# Patient Record
Sex: Female | Born: 1965 | Race: Black or African American | Hispanic: No | Marital: Single | Smoking: Former smoker
Health system: Southern US, Community
[De-identification: ages and names within clinical notes are randomized; demographics above are authoritative.]

## PROBLEM LIST (undated history)

## (undated) DIAGNOSIS — R002 Palpitations: Secondary | ICD-10-CM

## (undated) DIAGNOSIS — R202 Paresthesia of skin: Secondary | ICD-10-CM

## (undated) DIAGNOSIS — E669 Obesity, unspecified: Secondary | ICD-10-CM

## (undated) DIAGNOSIS — D219 Benign neoplasm of connective and other soft tissue, unspecified: Secondary | ICD-10-CM

## (undated) DIAGNOSIS — R200 Anesthesia of skin: Secondary | ICD-10-CM

## (undated) DIAGNOSIS — D649 Anemia, unspecified: Secondary | ICD-10-CM

## (undated) HISTORY — DX: Benign neoplasm of connective and other soft tissue, unspecified: D21.9

## (undated) HISTORY — DX: Obesity, unspecified: E66.9

---

## 2003-10-08 ENCOUNTER — Emergency Department (HOSPITAL_COMMUNITY): Admission: EM | Admit: 2003-10-08 | Discharge: 2003-10-08 | Payer: Self-pay | Admitting: Family Medicine

## 2005-06-13 ENCOUNTER — Encounter: Admission: RE | Admit: 2005-06-13 | Discharge: 2005-06-13 | Payer: Self-pay | Admitting: Family Medicine

## 2005-08-03 ENCOUNTER — Other Ambulatory Visit: Admission: RE | Admit: 2005-08-03 | Discharge: 2005-08-03 | Payer: Self-pay | Admitting: Family Medicine

## 2005-08-10 ENCOUNTER — Encounter: Admission: RE | Admit: 2005-08-10 | Discharge: 2005-08-10 | Payer: Self-pay | Admitting: Family Medicine

## 2007-10-11 ENCOUNTER — Emergency Department (HOSPITAL_COMMUNITY): Admission: EM | Admit: 2007-10-11 | Discharge: 2007-10-11 | Payer: Self-pay | Admitting: Family Medicine

## 2010-07-10 ENCOUNTER — Emergency Department (HOSPITAL_COMMUNITY): Admission: EM | Admit: 2010-07-10 | Discharge: 2010-07-10 | Payer: Self-pay | Admitting: Family Medicine

## 2010-07-13 ENCOUNTER — Emergency Department (HOSPITAL_COMMUNITY)
Admission: EM | Admit: 2010-07-13 | Discharge: 2010-07-13 | Payer: Self-pay | Source: Home / Self Care | Admitting: Family Medicine

## 2010-10-25 LAB — CULTURE, ROUTINE-ABSCESS: Culture: NO GROWTH

## 2011-05-05 LAB — INFLUENZA A AND B ANTIGEN (CONVERTED LAB)
Inflenza A Ag: POSITIVE — AB
Influenza B Ag: NEGATIVE

## 2011-08-22 ENCOUNTER — Other Ambulatory Visit: Payer: Self-pay | Admitting: Family Medicine

## 2011-08-22 DIAGNOSIS — Z1231 Encounter for screening mammogram for malignant neoplasm of breast: Secondary | ICD-10-CM

## 2011-09-21 ENCOUNTER — Ambulatory Visit (HOSPITAL_COMMUNITY)
Admission: RE | Admit: 2011-09-21 | Discharge: 2011-09-21 | Disposition: A | Discharge status: Home / Self Care | Payer: Self-pay | Source: Ambulatory Visit | Attending: Family Medicine | Admitting: Family Medicine

## 2011-09-21 DIAGNOSIS — Z1231 Encounter for screening mammogram for malignant neoplasm of breast: Secondary | ICD-10-CM

## 2011-11-23 ENCOUNTER — Emergency Department (HOSPITAL_COMMUNITY)
Admission: EM | Admit: 2011-11-23 | Discharge: 2011-11-23 | Disposition: A | Discharge status: Home / Self Care | Payer: Self-pay | Attending: Emergency Medicine | Admitting: Emergency Medicine

## 2011-11-23 ENCOUNTER — Encounter (HOSPITAL_COMMUNITY): Payer: Self-pay | Admitting: *Deleted

## 2011-11-23 DIAGNOSIS — M545 Low back pain, unspecified: Secondary | ICD-10-CM | POA: Insufficient documentation

## 2011-11-23 DIAGNOSIS — J45909 Unspecified asthma, uncomplicated: Secondary | ICD-10-CM | POA: Insufficient documentation

## 2011-11-23 DIAGNOSIS — M549 Dorsalgia, unspecified: Secondary | ICD-10-CM

## 2011-11-23 MED ORDER — HYDROCODONE-ACETAMINOPHEN 5-325 MG PO TABS: 2.0000 | ORAL_TABLET | ORAL | Status: AC | PRN

## 2011-11-23 MED ORDER — CYCLOBENZAPRINE HCL 10 MG PO TABS: 10.0000 mg | ORAL_TABLET | Freq: Two times a day (BID) | ORAL | Status: AC | PRN

## 2011-11-23 MED ORDER — IBUPROFEN 600 MG PO TABS: 600.0000 mg | ORAL_TABLET | Freq: Four times a day (QID) | ORAL | Status: AC | PRN

## 2011-11-23 NOTE — ED Provider Notes (Signed)
History     CSN: [id number]  Arrival date & time 11/23/11  1556   First MD Initiated Contact with Patient 11/23/11 1707      Chief Complaint  Patient presents with  . Back Pain   HPI Pt has had increase in lower back pain. Pt denies any injury. Pt has increase of pain with movement.          Past Medical History  Diagnosis Date  . Asthma     History reviewed. No pertinent past surgical history.  History reviewed. No pertinent family history.  History  Substance Use Topics  . Smoking status: Never Smoker   . Smokeless tobacco: Not on file  . Alcohol Use: Yes     occasionally    OB History    Grav Para Term Preterm Abortions TAB SAB Ect Mult Living                  Review of Systems  All other systems reviewed and are negative.    Allergies  Review of patient's allergies indicates no known allergies.  Home Medications   Current Outpatient Rx  Name Route Sig Dispense Refill  . ACETAMINOPHEN ER 650 MG PO TBCR Oral Take 1,300 mg by mouth once. For pain    . FLUTICASONE PROPIONATE  HFA 220 MCG/ACT IN AERO Inhalation Inhale 1 puff into the lungs 2 (two) times daily as needed. For wheezing    . CYCLOBENZAPRINE HCL 10 MG PO TABS Oral Take 1 tablet (10 mg total) by mouth 2 (two) times daily as needed for muscle spasms. 20 tablet 0  . HYDROCODONE-ACETAMINOPHEN 5-325 MG PO TABS Oral Take 2 tablets by mouth every 4 (four) hours as needed for pain. 6 tablet 0  . IBUPROFEN 600 MG PO TABS Oral Take 1 tablet (600 mg total) by mouth every 6 (six) hours as needed for pain. 30 tablet 0    BP 128/64  Pulse 95  Temp(Src) 98.3 F (36.8 C) (Oral)  Resp 18  SpO2 100%  Physical Exam  Nursing note and vitals reviewed. Constitutional: She is oriented to person, place, and time. She appears well-developed and well-nourished. No distress.  HENT:  Head: Normocephalic and atraumatic.  Eyes: Pupils are equal, round, and reactive to light.  Neck: Normal range of motion.    Cardiovascular: Normal rate and intact distal pulses.   Pulmonary/Chest: No respiratory distress.  Abdominal: Normal appearance. She exhibits no distension.  Musculoskeletal:       Lumbar back: She exhibits decreased range of motion, tenderness and spasm.  Neurological: She is alert and oriented to person, place, and time. No cranial nerve deficit.  Skin: Skin is warm and dry. No rash noted.  Psychiatric: She has a normal mood and affect. Her behavior is normal.    ED Course  Procedures (including critical care time)  Labs Reviewed - No data to display No results found.   1. Back pain       MDM         Nelia Shi, MD 11/23/11 1754

## 2011-11-23 NOTE — Discharge Instructions (Signed)
Back Exercises   Back exercises help treat and prevent back injuries. The goal of back exercises is to increase the strength of your abdominal and back muscles and the flexibility of your back. These exercises should be started when you no longer have back pain. Back exercises include:   Pelvic Tilt. Lie on your back with your knees bent. Tilt your pelvis until the lower part of your back is against the floor. Hold this position 5 to 10 sec and repeat 5 to 10 times.   Knee to Chest. Pull first 1 knee up against your chest and hold for 20 to 30 seconds, repeat this with the other knee, and then both knees. This may be done with the other leg straight or bent, whichever feels better.   Sit-Ups or Curl-Ups. Bend your knees 90 degrees. Start with tilting your pelvis, and do a partial, slow sit-up, lifting your trunk only 30 to 45 degrees off the floor. Take at least 2 to 3 seconds for each sit-up. Do not do sit-ups with your knees out straight. If partial sit-ups are difficult, simply do the above but with only tightening your abdominal muscles and holding it as directed.   Hip-Lift. Lie on your back with your knees flexed 90 degrees. Push down with your feet and shoulders as you raise your hips a couple inches off the floor; hold for 10 seconds, repeat 5 to 10 times.   Back arches. Lie on your stomach, propping yourself up on bent elbows. Slowly press on your hands, causing an arch in your low back. Repeat 3 to 5 times. Any initial stiffness and discomfort should lessen with repetition over time.   Shoulder-Lifts. Lie face down with arms beside your body. Keep hips and torso pressed to floor as you slowly lift your head and shoulders off the floor.   Do not overdo your exercises, especially in the beginning. Exercises may cause you some mild back discomfort which lasts for a few minutes; however, if the pain is more severe, or lasts for more than 15 minutes, do not continue exercises until you see your caregiver.  Improvement with exercise therapy for back problems is slow.   See your caregivers for assistance with developing a proper back exercise program.   Document Released: 09/07/2004 Document Revised: 07/20/2011 Document Reviewed: 07/31/2005   ExitCare® Patient Information ©2012 [employer].     Back Pain, Adult   Low back pain is very common. About 1 in 5 people have back pain. The cause of low back pain is rarely dangerous. The pain often gets better over time. About half of people with a sudden onset of back pain feel better in just 2 weeks. About 8 in 10 people feel better by 6 weeks.   CAUSES   Some common causes of back pain include:   Strain of the muscles or ligaments supporting the spine.   Wear and tear (degeneration) of the spinal discs.   Arthritis.   Direct injury to the back.   DIAGNOSIS   Most of the time, the direct cause of low back pain is not known. However, back pain can be treated effectively even when the exact cause of the pain is unknown. Answering your caregiver's questions about your overall health and symptoms is one of the most accurate ways to make sure the cause of your pain is not dangerous. If your caregiver needs more information, he or she may order lab work or imaging tests (X-rays or MRIs). However, even   if imaging tests show changes in your back, this usually does not require surgery.   HOME CARE INSTRUCTIONS   For many people, back pain returns. Since low back pain is rarely dangerous, it is often a condition that people can learn to manage on their own.   Remain active. It is stressful on the back to sit or stand in one place. Do not sit, drive, or stand in one place for more than 30 minutes at a time. Take short walks on level surfaces as soon as pain allows. Try to increase the length of time you walk each day.   Do not stay in bed. Resting more than 1 or 2 days can delay your recovery.   Do not avoid exercise or work. Your body is made to move. It is not dangerous to be active,  even though your back may hurt. Your back will likely heal faster if you return to being active before your pain is gone.   Pay attention to your body when you bend and lift. Many people have less discomfort when lifting if they bend their knees, keep the load close to their bodies, and avoid twisting. Often, the most comfortable positions are those that put less stress on your recovering back.   Find a comfortable position to sleep. Use a firm mattress and lie on your side with your knees slightly bent. If you lie on your back, put a pillow under your knees.   Only take over-the-counter or prescription medicines as directed by your caregiver. Over-the-counter medicines to reduce pain and inflammation are often the most helpful. Your caregiver may prescribe muscle relaxant drugs. These medicines help dull your pain so you can more quickly return to your normal activities and healthy exercise.   Put ice on the injured area.   Put ice in a plastic bag.   Place a towel between your skin and the bag.   Leave the ice on for 15 to 20 minutes, 3 to 4 times a day for the first 2 to 3 days. After that, ice and heat may be alternated to reduce pain and spasms.   Ask your caregiver about trying back exercises and gentle massage. This may be of some benefit.   Avoid feeling anxious or stressed. Stress increases muscle tension and can worsen back pain. It is important to recognize when you are anxious or stressed and learn ways to manage it. Exercise is a great option.   SEEK MEDICAL CARE IF:   You have pain that is not relieved with rest or medicine.   You have pain that does not improve in 1 week.   You have new symptoms.   You are generally not feeling well.   SEEK IMMEDIATE MEDICAL CARE IF:   You have pain that radiates from your back into your legs.   You develop new bowel or bladder control problems.   You have unusual weakness or numbness in your arms or legs.   You develop nausea or vomiting.   You develop abdominal  pain.   You feel faint.   Document Released: 07/31/2005 Document Revised: 07/20/2011 Document Reviewed: 12/19/2010   ExitCare® Patient Information ©2012 [employer].

## 2011-11-23 NOTE — ED Notes (Signed)
Pt has had increase in lower back pain.  Pt denies any injury.  Pt has increase of pain with movement.

## 2012-04-18 ENCOUNTER — Encounter: Payer: Self-pay | Admitting: Obstetrics & Gynecology

## 2012-05-01 ENCOUNTER — Encounter: Payer: Self-pay | Admitting: Obstetrics & Gynecology

## 2012-05-10 ENCOUNTER — Encounter: Payer: Self-pay | Admitting: Obstetrics & Gynecology

## 2012-05-22 ENCOUNTER — Encounter: Payer: Self-pay | Admitting: Obstetrics & Gynecology

## 2012-05-29 ENCOUNTER — Encounter: Payer: Self-pay | Admitting: Obstetrics & Gynecology

## 2012-05-29 ENCOUNTER — Ambulatory Visit (INDEPENDENT_AMBULATORY_CARE_PROVIDER_SITE_OTHER): Payer: Self-pay | Admitting: Obstetrics & Gynecology

## 2012-05-29 VITALS — BP 131/78 | HR 89 | Temp 97.5°F | Ht 63.0 in | Wt 180.6 lb

## 2012-05-29 DIAGNOSIS — D259 Leiomyoma of uterus, unspecified: Secondary | ICD-10-CM

## 2012-05-29 DIAGNOSIS — D219 Benign neoplasm of connective and other soft tissue, unspecified: Secondary | ICD-10-CM

## 2012-05-29 NOTE — Progress Notes (Signed)
  Subjective:    Patient ID: Abigail Brewer, female    DOB: July 16, 1966, 46 y.o.   MRN: [id number]  HPI  Ms Ken is a S AA referred from the [facility] for evaluation of known asymptomatic fibroids. A u/s in 2006 showed enlarged uterus with fibroids. She reports 3 days of heavy monthly periods followed by 2 lighter days. She denies dyspareunia or IMB.  Review of Systems She does have chronic pain that worsens intermittently, not related to her menses. She is in a same-sex relationship    Objective:   Physical Exam  16-18 week mobile uterus, non-tender No adnexal masses      Assessment & Plan:  Fibroids- I have offered her an u/s and even surgery should she desire, but at this point she is satisfied with watchful waiting.

## 2012-08-17 ENCOUNTER — Emergency Department (HOSPITAL_COMMUNITY): Payer: Self-pay

## 2012-08-17 ENCOUNTER — Encounter (HOSPITAL_COMMUNITY): Payer: Self-pay | Admitting: Emergency Medicine

## 2012-08-17 ENCOUNTER — Emergency Department (HOSPITAL_COMMUNITY)
Admission: EM | Admit: 2012-08-17 | Discharge: 2012-08-17 | Disposition: A | Discharge status: Home / Self Care | Payer: Self-pay | Attending: Emergency Medicine | Admitting: Emergency Medicine

## 2012-08-17 DIAGNOSIS — R000 Tachycardia, unspecified: Secondary | ICD-10-CM | POA: Insufficient documentation

## 2012-08-17 DIAGNOSIS — R51 Headache: Secondary | ICD-10-CM | POA: Insufficient documentation

## 2012-08-17 DIAGNOSIS — F17200 Nicotine dependence, unspecified, uncomplicated: Secondary | ICD-10-CM | POA: Insufficient documentation

## 2012-08-17 DIAGNOSIS — Z872 Personal history of diseases of the skin and subcutaneous tissue: Secondary | ICD-10-CM | POA: Insufficient documentation

## 2012-08-17 DIAGNOSIS — J45901 Unspecified asthma with (acute) exacerbation: Secondary | ICD-10-CM | POA: Insufficient documentation

## 2012-08-17 DIAGNOSIS — R109 Unspecified abdominal pain: Secondary | ICD-10-CM | POA: Insufficient documentation

## 2012-08-17 DIAGNOSIS — J45909 Unspecified asthma, uncomplicated: Secondary | ICD-10-CM

## 2012-08-17 DIAGNOSIS — M549 Dorsalgia, unspecified: Secondary | ICD-10-CM | POA: Insufficient documentation

## 2012-08-17 HISTORY — DX: Benign neoplasm of connective and other soft tissue, unspecified: D21.9

## 2012-08-17 MED ORDER — PREDNISONE 10 MG PO TABS: 20.0000 mg | ORAL_TABLET | Freq: Every day | ORAL | Status: DC

## 2012-08-17 MED ORDER — PREDNISONE 20 MG PO TABS
60.0000 mg | ORAL_TABLET | Freq: Once | ORAL | Status: AC
  Administered 2012-08-17: 60 mg via ORAL
  Filled 2012-08-17: qty 3

## 2012-08-17 MED ORDER — IPRATROPIUM BROMIDE 0.02 % IN SOLN
0.5000 mg | Freq: Once | RESPIRATORY_TRACT | Status: AC
  Administered 2012-08-17: 0.5 mg via RESPIRATORY_TRACT
  Filled 2012-08-17: qty 2.5

## 2012-08-17 MED ORDER — ALBUTEROL SULFATE (5 MG/ML) 0.5% IN NEBU
2.5000 mg | INHALATION_SOLUTION | Freq: Once | RESPIRATORY_TRACT | Status: AC
  Administered 2012-08-17: 2.5 mg via RESPIRATORY_TRACT
  Filled 2012-08-17: qty 0.5

## 2012-08-17 MED ORDER — ALBUTEROL SULFATE HFA 108 (90 BASE) MCG/ACT IN AERS
2.0000 | INHALATION_SPRAY | RESPIRATORY_TRACT | Status: DC | PRN
  Administered 2012-08-17: 2 via RESPIRATORY_TRACT
  Filled 2012-08-17: qty 6.7

## 2012-08-17 NOTE — ED Notes (Addendum)
Pt c/o cough x 1 year, pt states SHOB worse yesterday. Pt would also like abdomen xray and back d/t increased back pain.

## 2012-08-17 NOTE — ED Provider Notes (Signed)
Medical screening examination/treatment/procedure(s) were performed by non-physician practitioner and as supervising physician I was immediately available for consultation/collaboration.  Con Arganbright R. Tyshika Baldridge, MD 08/17/12 0728 

## 2012-08-17 NOTE — ED Notes (Signed)
Pt sts that she is feeling much better after the breathing treatment.

## 2012-08-17 NOTE — ED Provider Notes (Signed)
History     CSN: [id number]  Arrival date & time 08/17/12  0015   First MD Initiated Contact with Patient 08/17/12 [phone]      Chief Complaint  Patient presents with  . Cough    (Consider location/radiation/quality/duration/timing/severity/associated sxs/prior treatment) HPI Comments: History of untreated asthma states has been coughing since last year but worse over the past several weeks   Has borrowed a friends inhaler occasionally with some relief .  Also has a fibroid that she thinks is pushing on her back causing her to have pain in her lower back for quit a while but tonight wants an answer as to why her back hurts. She has an OB/GYN that is following her fibroid. And [facility]    The history is provided by the patient.    Past Medical History  Diagnosis Date  . Asthma   . Fibroid     History reviewed. No pertinent past surgical history.  No family history on file.  History  Substance Use Topics  . Smoking status: Current Some Day Smoker  . Smokeless tobacco: Not on file  . Alcohol Use: Yes     Comment: occasionally    OB History    Grav Para Term Preterm Abortions TAB SAB Ect Mult Living                  Review of Systems  Constitutional: Negative for fever and chills.  Respiratory: Positive for shortness of breath and wheezing.   Cardiovascular: Negative for chest pain.  Gastrointestinal: Positive for abdominal pain.  Genitourinary: Negative for dysuria and vaginal discharge.  Musculoskeletal: Positive for back pain.  Skin: Negative for rash and wound.  Neurological: Positive for headaches. Negative for dizziness and weakness.    Allergies  Review of patient's allergies indicates no known allergies.  Home Medications   Current Outpatient Rx  Name  Route  Sig  Dispense  Refill  . ACETAMINOPHEN ER 650 MG PO TBCR   Oral   Take 1,300 mg by mouth once. For pain         . FLUTICASONE  PROPIONATE  HFA 220 MCG/ACT IN AERO   Inhalation   Inhale 1 puff into the lungs 2 (two) times daily as needed. For wheezing         . HAIR/SKIN/NAILS PO   Oral   Take by mouth.           BP 132/84  Pulse 115  Temp 98.4 F (36.9 C) (Oral)  Resp 18  Ht 5\' 3"  (1.6 m)  Wt 176 lb 8 oz (80.06 kg)  BMI 31.27 kg/m2  SpO2 100%  LMP 08/10/2012  Physical Exam  Constitutional: She appears well-developed and well-nourished.  HENT:  Head: Normocephalic.  Neck: Normal range of motion.  Cardiovascular: Tachycardia present.   Pulmonary/Chest: She has wheezes. She exhibits tenderness.  Abdominal: Soft. She exhibits no distension.  Musculoskeletal: Normal range of motion.  Neurological: She is alert.  Skin: Skin is warm.    ED Course  Procedures (including critical care time)  Labs Reviewed - No data to display No results found.   No diagnosis found.    MDM  After neb treatment and Prednisone patient no longer wheezing         Arman Filter, NP 08/17/12 [phone]

## 2012-12-04 ENCOUNTER — Other Ambulatory Visit (HOSPITAL_COMMUNITY): Payer: Self-pay | Admitting: Nurse Practitioner

## 2012-12-04 DIAGNOSIS — Z1231 Encounter for screening mammogram for malignant neoplasm of breast: Secondary | ICD-10-CM

## 2012-12-10 ENCOUNTER — Ambulatory Visit (HOSPITAL_COMMUNITY)
Admission: RE | Admit: 2012-12-10 | Discharge: 2012-12-10 | Disposition: A | Discharge status: Home / Self Care | Payer: Self-pay | Source: Ambulatory Visit | Attending: Nurse Practitioner | Admitting: Nurse Practitioner

## 2012-12-10 DIAGNOSIS — Z1231 Encounter for screening mammogram for malignant neoplasm of breast: Secondary | ICD-10-CM

## 2013-12-09 ENCOUNTER — Other Ambulatory Visit: Payer: Self-pay | Admitting: Obstetrics & Gynecology

## 2013-12-09 DIAGNOSIS — Z1231 Encounter for screening mammogram for malignant neoplasm of breast: Secondary | ICD-10-CM

## 2013-12-11 ENCOUNTER — Ambulatory Visit (HOSPITAL_COMMUNITY)
Admission: RE | Admit: 2013-12-11 | Discharge: 2013-12-11 | Disposition: A | Discharge status: Home / Self Care | Payer: Self-pay | Source: Ambulatory Visit | Attending: Obstetrics & Gynecology | Admitting: Obstetrics & Gynecology

## 2013-12-11 DIAGNOSIS — Z1231 Encounter for screening mammogram for malignant neoplasm of breast: Secondary | ICD-10-CM

## 2015-01-13 ENCOUNTER — Encounter: Payer: Self-pay | Admitting: Obstetrics & Gynecology

## 2015-01-13 ENCOUNTER — Ambulatory Visit (INDEPENDENT_AMBULATORY_CARE_PROVIDER_SITE_OTHER): Payer: Self-pay | Admitting: Obstetrics & Gynecology

## 2015-01-13 VITALS — BP 129/86 | HR 89 | Temp 98.2°F | Resp 20 | Ht 63.0 in | Wt 179.9 lb

## 2015-01-13 DIAGNOSIS — Z1151 Encounter for screening for human papillomavirus (HPV): Secondary | ICD-10-CM

## 2015-01-13 DIAGNOSIS — N938 Other specified abnormal uterine and vaginal bleeding: Secondary | ICD-10-CM

## 2015-01-13 DIAGNOSIS — Z124 Encounter for screening for malignant neoplasm of cervix: Secondary | ICD-10-CM

## 2015-01-13 DIAGNOSIS — D250 Submucous leiomyoma of uterus: Secondary | ICD-10-CM

## 2015-01-13 DIAGNOSIS — D219 Benign neoplasm of connective and other soft tissue, unspecified: Secondary | ICD-10-CM | POA: Insufficient documentation

## 2015-01-13 DIAGNOSIS — Z01419 Encounter for gynecological examination (general) (routine) without abnormal findings: Secondary | ICD-10-CM

## 2015-01-13 DIAGNOSIS — Z0000 Encounter for general adult medical examination without abnormal findings: Secondary | ICD-10-CM

## 2015-01-13 LAB — CBC
HCT: 24.4 % — ABNORMAL LOW (ref 36.0–46.0)
Hemoglobin: 6.9 g/dL — CL (ref 12.0–15.0)
MCH: 15.7 pg — ABNORMAL LOW (ref 26.0–34.0)
MCHC: 28.3 g/dL — ABNORMAL LOW (ref 30.0–36.0)
MCV: 55.6 fL — ABNORMAL LOW (ref 78.0–100.0)
Platelets: 345 10*3/uL (ref 150–400)
RBC: 4.39 MIL/uL (ref 3.87–5.11)
RDW: 19.3 % — ABNORMAL HIGH (ref 11.5–15.5)
WBC: 5.2 10*3/uL (ref 4.0–10.5)

## 2015-01-13 LAB — TSH: TSH: 1.222 u[IU]/mL (ref 0.350–4.500)

## 2015-01-13 NOTE — Progress Notes (Signed)
Pt wants to know if she has a hernia and if so what can be done about it.  She also wants additional information about her fibroids.

## 2015-01-13 NOTE — Progress Notes (Signed)
Subjective:    Abigail Brewer is a 49 y.o. SAA P1  female who presents for an annual exam. The patient has no complaints today except irregular periods. She is concerned that she may have a hernia (due to "inflamation" epigastric area- ?reference to a hiatal hernia) The patient is not currently sexually active. GYN screening history: last pap: was normal. The patient wears seatbelts: yes. The patient participates in regular exercise: no. Has the patient ever been transfused or tattooed?: yes. The patient reports that there is not domestic violence in her life.   Menstrual History: OB History    Gravida Para Term Preterm AB TAB SAB Ectopic Multiple Living   1 1 1       1       Menarche age: 28  Patient's last menstrual period was 11/29/2014.    The following portions of the patient's history were reviewed and updated as appropriate: allergies, current medications, past family history, past medical history, past social history, past surgical history and problem list.  Review of Systems A comprehensive review of systems was negative. She works Warehouse manager.    Objective:    BP 129/86 mmHg  Pulse 89  Temp(Src) 98.2 F (36.8 C) (Oral)  Resp 20  Ht 5\' 3"  (1.6 m)  Wt 179 lb 14.4 oz (81.602 kg)  BMI 31.88 kg/m2  LMP 11/29/2014  General Appearance:    Alert, cooperative, no distress, appears stated age  Head:    Normocephalic, without obvious abnormality, atraumatic  Eyes:    PERRL, conjunctiva/corneas clear, EOM's intact, fundi    benign, both eyes  Ears:    Normal TM's and external ear canals, both ears  Nose:   Nares normal, septum midline, mucosa normal, no drainage    or sinus tenderness  Throat:   Lips, mucosa, and tongue normal; teeth and gums normal  Neck:   Supple, symmetrical, trachea midline, no adenopathy;    thyroid:  no enlargement/tenderness/nodules; no carotid   bruit or JVD  Back:     Symmetric, no curvature, ROM normal, no CVA tenderness  Lungs:     Clear to  auscultation bilaterally, respirations unlabored  Chest Wall:    No tenderness or deformity   Heart:    Regular rate and rhythm, S1 and S2 normal, no murmur, rub   or gallop  Breast Exam:    No tenderness, masses, or nipple abnormality  Abdomen:     Soft, non-tender, bowel sounds active all four quadrants,    no masses, no organomegaly  Genitalia:    Normal female without lesion, discharge or tenderness, 14 week size fibroid uterus, no palpable adnexal masses     Extremities:   Extremities normal, atraumatic, no cyanosis or edema  Pulses:   2+ and symmetric all extremities  Skin:   Skin color, texture, turgor normal, no rashes or lesions  Lymph nodes:   Cervical, supraclavicular, and axillary nodes normal  Neurologic:   CNII-XII intact, normal strength, sensation and reflexes    throughout  .    Assessment:    Healthy female exam.    Plan:     Breast self exam technique reviewed and patient encouraged to perform self-exam monthly. Pelvic ultrasound. Thin prep Pap smear.   She does not want any referrals today due to her lack of insurance

## 2015-01-13 NOTE — Addendum Note (Signed)
Addended by: Langston Reusing on: 01/13/2015 04:40 PM   Modules accepted: Level of Service

## 2015-01-14 ENCOUNTER — Telehealth: Payer: Self-pay | Admitting: *Deleted

## 2015-01-14 LAB — CYTOLOGY - PAP

## 2015-01-14 MED ORDER — FERROUS SULFATE 325 (65 FE) MG PO TABS: 325.0000 mg | ORAL_TABLET | Freq: Two times a day (BID) | ORAL | Status: DC

## 2015-01-14 NOTE — Telephone Encounter (Signed)
Called pt and informed her of need for iron supplement due to anemia. Dosage instructions were given. I also reviewed with her that she will be receiving a call regarding her Mammogram appt (scholarship application completed and faxed on 6/1). Once she has submitted her financial aid application and received a response, we can schedule her [place] as ordered by Dr. Hulan Fray. Pt voiced understanding of all information.

## 2015-01-26 ENCOUNTER — Ambulatory Visit (HOSPITAL_COMMUNITY)
Admission: RE | Admit: 2015-01-26 | Discharge: 2015-01-26 | Disposition: A | Discharge status: Home / Self Care | Payer: Self-pay | Source: Ambulatory Visit | Attending: Obstetrics & Gynecology | Admitting: Obstetrics & Gynecology

## 2015-01-26 DIAGNOSIS — Z0000 Encounter for general adult medical examination without abnormal findings: Secondary | ICD-10-CM

## 2015-06-02 ENCOUNTER — Other Ambulatory Visit: Payer: Self-pay | Admitting: Obstetrics & Gynecology

## 2015-06-11 ENCOUNTER — Telehealth: Payer: Self-pay | Admitting: *Deleted

## 2015-06-11 DIAGNOSIS — Z7689 Persons encountering health services in other specified circumstances: Secondary | ICD-10-CM

## 2015-06-11 NOTE — Telephone Encounter (Signed)
Patient called and left message stating she needs to speak with someone before she goes into work at [employer]. Patient states she gets off at 3pm and needs to speak with someone preferably to Dr Hulan Fray about an important issue. Called patient, no answer- left message stating we are trying to reach you to return your phone call. We are now closed for the weekend and will reopen at 8am on Monday.

## 2015-06-14 NOTE — Telephone Encounter (Signed)
Patient called and left another message stating she is calling and would like to speak with Dr Hulan Fray personally or someone that can get her in touch with Dr Hulan Fray. Patient states when we call back she will describe everything that is going on. Called patient and she states that when she was here months ago for bleeding and possible fibroids, Dr Hulan Fray wanted her to have a ultrasound done and she also possibly had a hernia. Patient states that she also filled out the financial application but hasn't heard anything about it. Patient states she is continuing to have irregular bleeding. Discussed with patient that she needs to call billing to check on the status of her application because she should have heard something by now and provided their number. Also discussed with patient that Dr Hulan Fray cannot do anything for her hernia and that she needs a PCP for that. Patient states she doesn't want to see 2-3 doctors, just one. Told patient that unfortunately Dr Hulan Fray cannot do that, only female related things. Discussed with patient importance of PCP and provided [place] number and instructions for an appt. Discussed with patient that she should call and schedule the ultrasound appt as we will need that for future follow up. Provided ultrasound's number and told patient that if she is approved, the application would cover the ultrasound. Patient verbalized understanding and asked what she would have to pay that day. Told patient I am not sure, she would need to ask ultrasound. Told patient once she has that appt scheduled, she can call us to schedule a follow up appt with Dr Hulan Fray. Patient verbalized understanding to all and asked if I would call back and leave those numbers on her voicemail. Told patient that I would. Patient verbalized understanding and had no other questions.

## 2015-06-24 ENCOUNTER — Ambulatory Visit (HOSPITAL_COMMUNITY)
Admission: RE | Admit: 2015-06-24 | Discharge: 2015-06-24 | Disposition: A | Discharge status: Home / Self Care | Payer: Self-pay | Source: Ambulatory Visit | Attending: Obstetrics & Gynecology | Admitting: Obstetrics & Gynecology

## 2015-06-24 DIAGNOSIS — D252 Subserosal leiomyoma of uterus: Secondary | ICD-10-CM | POA: Insufficient documentation

## 2015-06-24 DIAGNOSIS — N939 Abnormal uterine and vaginal bleeding, unspecified: Secondary | ICD-10-CM | POA: Insufficient documentation

## 2015-06-24 DIAGNOSIS — D250 Submucous leiomyoma of uterus: Secondary | ICD-10-CM | POA: Insufficient documentation

## 2015-08-02 ENCOUNTER — Ambulatory Visit: Payer: Self-pay | Admitting: Obstetrics & Gynecology

## 2015-08-18 ENCOUNTER — Ambulatory Visit (INDEPENDENT_AMBULATORY_CARE_PROVIDER_SITE_OTHER): Payer: Self-pay | Admitting: Obstetrics & Gynecology

## 2015-08-18 ENCOUNTER — Encounter: Payer: Self-pay | Admitting: Obstetrics & Gynecology

## 2015-08-18 VITALS — BP 132/86 | HR 72 | Temp 98.0°F | Resp 20 | Ht 63.0 in | Wt 180.3 lb

## 2015-08-18 DIAGNOSIS — D259 Leiomyoma of uterus, unspecified: Secondary | ICD-10-CM

## 2015-08-18 DIAGNOSIS — D500 Iron deficiency anemia secondary to blood loss (chronic): Secondary | ICD-10-CM

## 2015-08-18 LAB — CBC
HCT: 35.9 % — ABNORMAL LOW (ref 36.0–46.0)
Hemoglobin: 11.7 g/dL — ABNORMAL LOW (ref 12.0–15.0)
MCH: 26 pg (ref 26.0–34.0)
MCHC: 32.6 g/dL (ref 30.0–36.0)
MCV: 79.8 fL (ref 78.0–100.0)
MPV: 9.9 fL (ref 8.6–12.4)
Platelets: 322 10*3/uL (ref 150–400)
RBC: 4.5 MIL/uL (ref 3.87–5.11)
RDW: 13.2 % (ref 11.5–15.5)
WBC: 5.5 10*3/uL (ref 4.0–10.5)

## 2015-08-18 NOTE — Progress Notes (Signed)
   Subjective:    Patient ID: Abigail Brewer, female    DOB: 1966-07-30, 50 y.o.   MRN: [id number]  HPI 50 yo AA P1 (Abigail Brewer) here to discuss her u/s results. Her u/s was 11/16 and showed multiple fibroids. Her hbg 6/16 was 6.9 and she has been taking iron pills until recently.   Review of Systems  She is in a same-sex relationship.     Objective:   Physical Exam  WNWHBFNAD Breathing, conversing, and ambulating normally Abd- benign      Assessment & Plan:  Fibroids/anemia- re check cbc Offered TAH versus trial of depo provera. She wants a hysterectomy but she does not have insurance at this time so she will fill out the financial aid form. Depo provera today

## 2015-08-20 ENCOUNTER — Encounter (HOSPITAL_COMMUNITY): Payer: Self-pay | Admitting: *Deleted

## 2015-09-03 ENCOUNTER — Telehealth (HOSPITAL_COMMUNITY): Payer: Self-pay | Admitting: *Deleted

## 2015-09-03 ENCOUNTER — Other Ambulatory Visit: Payer: Self-pay | Admitting: Obstetrics & Gynecology

## 2015-09-03 NOTE — Telephone Encounter (Signed)
Patient called stating she was going to have to cancel her upcoming surgery, due to finances she cannot be out of work at this time.  She will call back when she is ready to reschedule.  She also stated she could not afford the depo that was also offered to her.  Patient indicated to me that she was having trouble getting her iron refilled.  I verified there were three refills left and verified with pharmacy as well.

## 2015-10-19 ENCOUNTER — Inpatient Hospital Stay: Admit: 2015-10-19 | Payer: Self-pay | Admitting: Obstetrics & Gynecology

## 2015-10-19 SURGERY — HYSTERECTOMY, ABDOMINAL
Anesthesia: General

## 2016-01-04 ENCOUNTER — Other Ambulatory Visit (HOSPITAL_COMMUNITY): Payer: Self-pay | Admitting: Obstetrics & Gynecology

## 2016-01-04 DIAGNOSIS — Z1231 Encounter for screening mammogram for malignant neoplasm of breast: Secondary | ICD-10-CM

## 2016-01-08 ENCOUNTER — Other Ambulatory Visit: Payer: Self-pay | Admitting: Obstetrics & Gynecology

## 2016-01-27 ENCOUNTER — Ambulatory Visit
Admission: RE | Admit: 2016-01-27 | Discharge: 2016-01-27 | Disposition: A | Discharge status: Home / Self Care | Payer: No Typology Code available for payment source | Source: Ambulatory Visit | Attending: Obstetrics & Gynecology | Admitting: Obstetrics & Gynecology

## 2016-01-27 DIAGNOSIS — Z1231 Encounter for screening mammogram for malignant neoplasm of breast: Secondary | ICD-10-CM

## 2016-02-01 ENCOUNTER — Other Ambulatory Visit: Payer: Self-pay | Admitting: Obstetrics & Gynecology

## 2016-03-08 ENCOUNTER — Telehealth: Payer: Self-pay | Admitting: *Deleted

## 2016-03-08 DIAGNOSIS — N938 Other specified abnormal uterine and vaginal bleeding: Secondary | ICD-10-CM

## 2016-03-08 NOTE — Telephone Encounter (Signed)
Received call on left by patient on nurse line on 03/07/16 at 1434.  Patient states she would like a refill on her iron and she has a question about her period and possible surgery.  Requests a return call to [phone].

## 2016-03-10 NOTE — Telephone Encounter (Signed)
I called Abigail Brewer and left a voicemail that I was returning her call, sorry that I missed her . We are closed until Monday- please call back then.

## 2016-03-13 MED ORDER — FERROUS SULFATE 325 (65 FE) MG PO TABS: 325.0000 mg | ORAL_TABLET | Freq: Two times a day (BID) | ORAL | 3 refills | Status: DC

## 2016-03-13 NOTE — Telephone Encounter (Signed)
Patient called into front office reporting multiple concerns/questions. Patient states she has been having blood in her stools for 5 years now and needs a colonoscopy. Patient states she is back bleeding again for a month now and needs her iron sent to her pharmacy because the refill never went there in the past. Patient states she was scheduled for surgery in march but had to cancel the surgery. She wants to know if we can get the surgery scheduled again without her having to come in and be seen. Asked patient if she has a PCP and she states no she doesn't have insurance and is unsure about [facility] financial assistance. Provided information of [employer] to patient & how to apply for orange card there. Told patient she needs to complete another [facility] financial application and that her surgery won't be scheduled until that has been received and approved. Also discussed that she will need to come back and see Dr Hulan Fray since we haven't seen her in 6 months. Patient verbalized understanding to all & is aware we will be mailing her a financial application and that someone from the front office will be contacting her with an appt. Patient had no other questions

## 2016-03-16 ENCOUNTER — Encounter: Payer: Self-pay | Admitting: Obstetrics & Gynecology

## 2016-03-21 NOTE — Telephone Encounter (Signed)
Pt called and stated that she received a call from this number. Call back if we need to speak with her.

## 2016-03-22 ENCOUNTER — Ambulatory Visit (INDEPENDENT_AMBULATORY_CARE_PROVIDER_SITE_OTHER): Payer: No Typology Code available for payment source | Admitting: Obstetrics & Gynecology

## 2016-03-22 ENCOUNTER — Encounter: Payer: Self-pay | Admitting: Obstetrics & Gynecology

## 2016-03-22 VITALS — BP 128/80 | HR 70 | Wt 171.1 lb

## 2016-03-22 DIAGNOSIS — D500 Iron deficiency anemia secondary to blood loss (chronic): Secondary | ICD-10-CM

## 2016-03-22 LAB — CBC
HEMATOCRIT: 39.6 % (ref 35.0–45.0)
HEMOGLOBIN: 13.1 g/dL (ref 11.7–15.5)
MCH: 27.9 pg (ref 27.0–33.0)
MCHC: 33.1 g/dL (ref 32.0–36.0)
MCV: 84.3 fL (ref 80.0–100.0)
MPV: 10.4 fL (ref 7.5–12.5)
Platelets: 285 10*3/uL (ref 140–400)
RBC: 4.7 MIL/uL (ref 3.80–5.10)
RDW: 13 % (ref 11.0–15.0)
WBC: 3.3 10*3/uL — ABNORMAL LOW (ref 3.8–10.8)

## 2016-03-22 NOTE — Progress Notes (Signed)
   Subjective:    Patient ID: Abigail Brewer, female    DOB: [birth year], 50 y.o.   MRN: [id number]  HPI 50 yo P1 (grown up daughter) here today because she is finally ready to have her hysterectomy. She has bled for the last 6 weeks. She finally stopped about 2 days ago. She is somewhat dizzy at times. She is taking her iron. She has had some hot flashes for about 6 months.   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 She declines a depo provera shot at this time.

## 2016-03-29 ENCOUNTER — Encounter (HOSPITAL_COMMUNITY): Payer: Self-pay | Admitting: *Deleted

## 2016-05-02 ENCOUNTER — Telehealth: Payer: Self-pay | Admitting: *Deleted

## 2016-05-02 NOTE — Telephone Encounter (Signed)
Pt left message yesterday @ [phone] stating that she had left papers @ the office to be completed for her job regarding having surgery. Her job has not received the papers. Please call back.

## 2016-05-02 NOTE — Patient Instructions (Signed)
Your procedure is scheduled on:  Tuesday, Oct. 3, 2017  Enter through the [employer] of [facility] at:  11:15 AM  Pick up the phone at the desk and dial [phone].  Call this number if you have problems the morning of surgery: [phone].  Remember: Do NOT eat food:  After Midnight Monday, Oct. 2, 2017  Do NOT drink clear liquids after:  8:30 AM day of surgery  Take these medicines the morning of surgery with a SIP OF WATER:  None  Bring Asthma Inhaler day of surgery  Do NOT wear jewelry (body piercing), metal hair clips/bobby pins, make-up, or nail polish. Do NOT wear lotions, powders, or perfumes.  You may wear deodorant. Do NOT shave for 48 hours prior to surgery. Do NOT bring valuables to the hospital. Contacts, dentures, or bridgework may not be worn into surgery.  Leave suitcase in car.  After surgery it may be brought to your room.  For patients admitted to the hospital, checkout time is 11:00 AM the day of discharge.

## 2016-05-02 NOTE — Telephone Encounter (Signed)
Patient FMLA paper work has been completed since last month. I have left patient a message regarding her paperwork.

## 2016-05-03 ENCOUNTER — Encounter (HOSPITAL_COMMUNITY): Payer: Self-pay

## 2016-05-03 ENCOUNTER — Encounter (HOSPITAL_COMMUNITY)
Admission: RE | Admit: 2016-05-03 | Discharge: 2016-05-03 | Disposition: A | Discharge status: Home / Self Care | Payer: Self-pay | Source: Ambulatory Visit | Attending: Obstetrics & Gynecology | Admitting: Obstetrics & Gynecology

## 2016-05-03 DIAGNOSIS — Z01818 Encounter for other preprocedural examination: Secondary | ICD-10-CM | POA: Insufficient documentation

## 2016-05-03 HISTORY — DX: Anesthesia of skin: R20.0

## 2016-05-03 HISTORY — DX: Palpitations: R00.2

## 2016-05-03 HISTORY — DX: Paresthesia of skin: R20.2

## 2016-05-03 HISTORY — DX: Anemia, unspecified: D64.9

## 2016-05-03 LAB — CBC
HEMATOCRIT: 38.8 % (ref 36.0–46.0)
Hemoglobin: 13.8 g/dL (ref 12.0–15.0)
MCH: 29.6 pg (ref 26.0–34.0)
MCHC: 35.6 g/dL (ref 30.0–36.0)
MCV: 83.1 fL (ref 78.0–100.0)
Platelets: 319 10*3/uL (ref 150–400)
RBC: 4.67 MIL/uL (ref 3.87–5.11)
RDW: 13.4 % (ref 11.5–15.5)
WBC: 3.7 10*3/uL — ABNORMAL LOW (ref 4.0–10.5)

## 2016-05-03 LAB — TYPE AND SCREEN
ABO/RH(D): O POS
ANTIBODY SCREEN: NEGATIVE

## 2016-05-03 LAB — ABO/RH: ABO/RH(D): O POS

## 2016-05-16 ENCOUNTER — Encounter (HOSPITAL_COMMUNITY): Payer: Self-pay | Admitting: Anesthesiology

## 2016-05-16 ENCOUNTER — Inpatient Hospital Stay (HOSPITAL_COMMUNITY): Payer: Self-pay | Admitting: Anesthesiology

## 2016-05-16 ENCOUNTER — Inpatient Hospital Stay (HOSPITAL_COMMUNITY)
Admission: RE | Admit: 2016-05-16 | Discharge: 2016-05-18 | DRG: 743 | Disposition: A | Discharge status: Home / Self Care | Payer: Self-pay | Source: Ambulatory Visit | Attending: Obstetrics & Gynecology | Admitting: Obstetrics & Gynecology

## 2016-05-16 ENCOUNTER — Encounter (HOSPITAL_COMMUNITY)
Admission: RE | Disposition: A | Discharge status: Home / Self Care | Payer: Self-pay | Source: Ambulatory Visit | Attending: Obstetrics & Gynecology

## 2016-05-16 DIAGNOSIS — D259 Leiomyoma of uterus, unspecified: Secondary | ICD-10-CM

## 2016-05-16 DIAGNOSIS — Z9889 Other specified postprocedural states: Secondary | ICD-10-CM

## 2016-05-16 DIAGNOSIS — D649 Anemia, unspecified: Secondary | ICD-10-CM | POA: Diagnosis present

## 2016-05-16 DIAGNOSIS — D251 Intramural leiomyoma of uterus: Principal | ICD-10-CM | POA: Diagnosis present

## 2016-05-16 DIAGNOSIS — D252 Subserosal leiomyoma of uterus: Secondary | ICD-10-CM | POA: Diagnosis present

## 2016-05-16 DIAGNOSIS — Z87891 Personal history of nicotine dependence: Secondary | ICD-10-CM

## 2016-05-16 HISTORY — PX: HYSTERECTOMY ABDOMINAL WITH SALPINGECTOMY: SHX6725

## 2016-05-16 HISTORY — PX: SALPINGOOPHORECTOMY: SHX82

## 2016-05-16 LAB — PREGNANCY, URINE: Preg Test, Ur: NEGATIVE

## 2016-05-16 SURGERY — HYSTERECTOMY, TOTAL, ABDOMINAL, WITH SALPINGECTOMY
Anesthesia: General | Site: Abdomen | Laterality: Bilateral

## 2016-05-16 MED ORDER — HYDROMORPHONE HCL 1 MG/ML IJ SOLN
0.2500 mg | INTRAMUSCULAR | Status: DC | PRN
  Administered 2016-05-16 (×2): 0.5 mg via INTRAVENOUS

## 2016-05-16 MED ORDER — ONDANSETRON HCL 4 MG/2ML IJ SOLN: 4.0000 mg | Freq: Once | INTRAMUSCULAR | Status: DC | PRN

## 2016-05-16 MED ORDER — KETOROLAC TROMETHAMINE 30 MG/ML IJ SOLN
INTRAMUSCULAR | Status: AC
  Filled 2016-05-16: qty 1

## 2016-05-16 MED ORDER — SUGAMMADEX SODIUM 200 MG/2ML IV SOLN
INTRAVENOUS | Status: DC | PRN
  Administered 2016-05-16: 157.4 mg via INTRAVENOUS

## 2016-05-16 MED ORDER — CEFAZOLIN SODIUM-DEXTROSE 2-4 GM/100ML-% IV SOLN
2.0000 g | INTRAVENOUS | Status: AC
  Administered 2016-05-16: 2 g via INTRAVENOUS

## 2016-05-16 MED ORDER — FLUMAZENIL 0.5 MG/5ML IV SOLN
INTRAVENOUS | Status: DC | PRN
  Administered 2016-05-16: .6 mg via INTRAVENOUS

## 2016-05-16 MED ORDER — OXYCODONE-ACETAMINOPHEN 5-325 MG PO TABS
1.0000 | ORAL_TABLET | ORAL | Status: DC | PRN
  Administered 2016-05-17: 2 via ORAL
  Administered 2016-05-17: 1 via ORAL
  Administered 2016-05-17 – 2016-05-18 (×3): 2 via ORAL
  Filled 2016-05-16 (×2): qty 2
  Filled 2016-05-16: qty 1
  Filled 2016-05-16 (×2): qty 2

## 2016-05-16 MED ORDER — FENTANYL CITRATE (PF) 100 MCG/2ML IJ SOLN
INTRAMUSCULAR | Status: AC
  Filled 2016-05-16: qty 2

## 2016-05-16 MED ORDER — PROPOFOL 10 MG/ML IV BOLUS
INTRAVENOUS | Status: AC
  Filled 2016-05-16: qty 20

## 2016-05-16 MED ORDER — IBUPROFEN 800 MG PO TABS
800.0000 mg | ORAL_TABLET | Freq: Three times a day (TID) | ORAL | Status: DC | PRN
  Administered 2016-05-17 – 2016-05-18 (×2): 800 mg via ORAL
  Filled 2016-05-16 (×2): qty 1

## 2016-05-16 MED ORDER — SUGAMMADEX SODIUM 500 MG/5ML IV SOLN
INTRAVENOUS | Status: AC
  Filled 2016-05-16: qty 5

## 2016-05-16 MED ORDER — OXYCODONE HCL 5 MG PO TABS: 5.0000 mg | ORAL_TABLET | Freq: Once | ORAL | Status: DC | PRN

## 2016-05-16 MED ORDER — HYDROMORPHONE HCL 1 MG/ML IJ SOLN
0.2000 mg | INTRAMUSCULAR | Status: DC | PRN
  Administered 2016-05-16 (×2): 0.6 mg via INTRAVENOUS
  Filled 2016-05-16 (×2): qty 1

## 2016-05-16 MED ORDER — MEPERIDINE HCL 25 MG/ML IJ SOLN
6.2500 mg | INTRAMUSCULAR | Status: DC | PRN
Start: 2016-05-16 — End: 2016-05-16
  Administered 2016-05-16 (×2): 12.5 mg via INTRAVENOUS

## 2016-05-16 MED ORDER — HYDROMORPHONE HCL 1 MG/ML IJ SOLN
INTRAMUSCULAR | Status: AC
  Filled 2016-05-16: qty 1

## 2016-05-16 MED ORDER — BUPIVACAINE HCL (PF) 0.5 % IJ SOLN
INTRAMUSCULAR | Status: DC | PRN
  Administered 2016-05-16: 30 mL

## 2016-05-16 MED ORDER — HYDROMORPHONE HCL 1 MG/ML IJ SOLN
INTRAMUSCULAR | Status: DC
Start: 2016-05-16 — End: 2016-05-16
  Filled 2016-05-16: qty 1

## 2016-05-16 MED ORDER — ROCURONIUM BROMIDE 100 MG/10ML IV SOLN
INTRAVENOUS | Status: AC
  Filled 2016-05-16: qty 1

## 2016-05-16 MED ORDER — MEPERIDINE HCL 25 MG/ML IJ SOLN
INTRAMUSCULAR | Status: AC
  Filled 2016-05-16: qty 1

## 2016-05-16 MED ORDER — ALBUTEROL SULFATE (2.5 MG/3ML) 0.083% IN NEBU
3.0000 mL | INHALATION_SOLUTION | Freq: Four times a day (QID) | RESPIRATORY_TRACT | Status: DC | PRN
Start: 2016-05-16 — End: 2016-05-18

## 2016-05-16 MED ORDER — ONDANSETRON HCL 4 MG/2ML IJ SOLN
INTRAMUSCULAR | Status: AC
  Filled 2016-05-16: qty 2

## 2016-05-16 MED ORDER — LIDOCAINE HCL (CARDIAC) 20 MG/ML IV SOLN
INTRAVENOUS | Status: DC | PRN
  Administered 2016-05-16: 30 mg via INTRAVENOUS
  Administered 2016-05-16: 70 mg via INTRAVENOUS

## 2016-05-16 MED ORDER — LACTATED RINGERS IV SOLN
INTRAVENOUS | Status: DC
  Administered 2016-05-16: 125 mL/h via INTRAVENOUS
  Administered 2016-05-16: 13:00:00 via INTRAVENOUS

## 2016-05-16 MED ORDER — MIDAZOLAM HCL 2 MG/2ML IJ SOLN
INTRAMUSCULAR | Status: AC
  Filled 2016-05-16: qty 2

## 2016-05-16 MED ORDER — SUGAMMADEX SODIUM 200 MG/2ML IV SOLN
INTRAVENOUS | Status: AC
Start: 2016-05-16 — End: 2016-05-16
  Filled 2016-05-16: qty 2

## 2016-05-16 MED ORDER — LIDOCAINE HCL (CARDIAC) 20 MG/ML IV SOLN
INTRAVENOUS | Status: AC
  Filled 2016-05-16: qty 5

## 2016-05-16 MED ORDER — ONDANSETRON HCL 4 MG/2ML IJ SOLN
INTRAMUSCULAR | Status: DC | PRN
  Administered 2016-05-16: 4 mg via INTRAVENOUS

## 2016-05-16 MED ORDER — KETOROLAC TROMETHAMINE 30 MG/ML IJ SOLN
INTRAMUSCULAR | Status: DC | PRN
  Administered 2016-05-16: 30 mg via INTRAVENOUS

## 2016-05-16 MED ORDER — PROPOFOL 10 MG/ML IV BOLUS
INTRAVENOUS | Status: DC | PRN
  Administered 2016-05-16: 180 mg via INTRAVENOUS

## 2016-05-16 MED ORDER — 0.9 % SODIUM CHLORIDE (POUR BTL) OPTIME
TOPICAL | Status: DC | PRN
  Administered 2016-05-16: 2000 mL

## 2016-05-16 MED ORDER — OXYCODONE HCL 5 MG/5ML PO SOLN: 5.0000 mg | Freq: Once | ORAL | Status: DC | PRN

## 2016-05-16 MED ORDER — ONDANSETRON HCL 4 MG PO TABS: 4.0000 mg | ORAL_TABLET | Freq: Four times a day (QID) | ORAL | Status: DC | PRN

## 2016-05-16 MED ORDER — SCOPOLAMINE 1 MG/3DAYS TD PT72
1.0000 | MEDICATED_PATCH | Freq: Once | TRANSDERMAL | Status: DC
  Administered 2016-05-16: 1.5 mg via TRANSDERMAL

## 2016-05-16 MED ORDER — FENTANYL CITRATE (PF) 100 MCG/2ML IJ SOLN
INTRAMUSCULAR | Status: DC | PRN
  Administered 2016-05-16 (×5): 50 ug via INTRAVENOUS
  Administered 2016-05-16: 25 ug via INTRAVENOUS
  Administered 2016-05-16: 50 ug via INTRAVENOUS
  Administered 2016-05-16: 25 ug via INTRAVENOUS

## 2016-05-16 MED ORDER — DEXAMETHASONE SODIUM PHOSPHATE 10 MG/ML IJ SOLN
INTRAMUSCULAR | Status: DC | PRN
  Administered 2016-05-16: 4 mg via INTRAVENOUS

## 2016-05-16 MED ORDER — SCOPOLAMINE 1 MG/3DAYS TD PT72
MEDICATED_PATCH | TRANSDERMAL | Status: AC
  Administered 2016-05-16: 1.5 mg via TRANSDERMAL
  Filled 2016-05-16: qty 1

## 2016-05-16 MED ORDER — ONDANSETRON HCL 4 MG/2ML IJ SOLN: 4.0000 mg | Freq: Four times a day (QID) | INTRAMUSCULAR | Status: DC | PRN

## 2016-05-16 MED ORDER — LACTATED RINGERS IV SOLN
INTRAVENOUS | Status: DC
  Administered 2016-05-16 – 2016-05-17 (×2): via INTRAVENOUS

## 2016-05-16 MED ORDER — FENTANYL CITRATE (PF) 250 MCG/5ML IJ SOLN
INTRAMUSCULAR | Status: AC
  Filled 2016-05-16: qty 5

## 2016-05-16 MED ORDER — DEXAMETHASONE SODIUM PHOSPHATE 4 MG/ML IJ SOLN
INTRAMUSCULAR | Status: AC
  Filled 2016-05-16: qty 1

## 2016-05-16 MED ORDER — ROCURONIUM BROMIDE 100 MG/10ML IV SOLN
INTRAVENOUS | Status: DC | PRN
  Administered 2016-05-16: 50 mg via INTRAVENOUS

## 2016-05-16 MED ORDER — BUPIVACAINE HCL (PF) 0.5 % IJ SOLN
INTRAMUSCULAR | Status: AC
  Filled 2016-05-16: qty 30

## 2016-05-16 MED ORDER — MIDAZOLAM HCL 2 MG/2ML IJ SOLN
INTRAMUSCULAR | Status: DC | PRN
  Administered 2016-05-16: 2 mg via INTRAVENOUS

## 2016-05-16 MED ORDER — FLUMAZENIL 0.5 MG/5ML IV SOLN
INTRAVENOUS | Status: AC
  Filled 2016-05-16: qty 5

## 2016-05-16 SURGICAL SUPPLY — 34 items
CANISTER SUCT 3000ML (MISCELLANEOUS) ×3 IMPLANT
CLOSURE WOUND 1/2 X4 (GAUZE/BANDAGES/DRESSINGS) ×1
CLOTH BEACON ORANGE TIMEOUT ST (SAFETY) ×3 IMPLANT
CONT PATH 16OZ SNAP LID 3702 (MISCELLANEOUS) ×3 IMPLANT
DECANTER SPIKE VIAL GLASS SM (MISCELLANEOUS) ×2 IMPLANT
DRAPE CESAREAN BIRTH W POUCH (DRAPES) ×3 IMPLANT
DRAPE WARM FLUID 44X44 (DRAPE) ×2 IMPLANT
DRSG OPSITE POSTOP 4X10 (GAUZE/BANDAGES/DRESSINGS) ×3 IMPLANT
DURAPREP 26ML APPLICATOR (WOUND CARE) ×3 IMPLANT
GAUZE SPONGE 4X4 16PLY XRAY LF (GAUZE/BANDAGES/DRESSINGS) ×2 IMPLANT
GLOVE BIO SURGEON STRL SZ 6.5 (GLOVE) ×2 IMPLANT
GLOVE BIO SURGEONS STRL SZ 6.5 (GLOVE) ×1
GLOVE BIOGEL PI IND STRL 7.0 (GLOVE) ×3 IMPLANT
GLOVE BIOGEL PI INDICATOR 7.0 (GLOVE) ×6
GOWN STRL REUS W/TWL LRG LVL3 (GOWN DISPOSABLE) ×13 IMPLANT
NDL SPNL 18GX3.5 QUINCKE PK (NEEDLE) ×1 IMPLANT
NEEDLE SPNL 18GX3.5 QUINCKE PK (NEEDLE) ×3 IMPLANT
NS IRRIG 1000ML POUR BTL (IV SOLUTION) ×5 IMPLANT
PACK ABDOMINAL GYN (CUSTOM PROCEDURE TRAY) ×3 IMPLANT
PAD OB MATERNITY 4.3X12.25 (PERSONAL CARE ITEMS) ×3 IMPLANT
PENCIL SMOKE EVAC W/HOLSTER (ELECTROSURGICAL) ×3 IMPLANT
PROTECTOR NERVE ULNAR (MISCELLANEOUS) ×4 IMPLANT
SPONGE LAP 18X18 X RAY DECT (DISPOSABLE) ×4 IMPLANT
STRIP CLOSURE SKIN 1/2X4 (GAUZE/BANDAGES/DRESSINGS) ×2 IMPLANT
SUT CHROMIC 3 0 SH 27 (SUTURE) IMPLANT
SUT PDS AB 0 CTX 60 (SUTURE) ×3 IMPLANT
SUT VIC AB 0 CT1 36 (SUTURE) ×3 IMPLANT
SUT VIC AB 2-0 CT1 18 (SUTURE) ×9 IMPLANT
SUT VIC AB 3-0 CT1 27 (SUTURE) ×3
SUT VIC AB 3-0 CT1 TAPERPNT 27 (SUTURE) ×1 IMPLANT
SYR 30ML LL (SYRINGE) ×3 IMPLANT
TOWEL OR 17X24 6PK STRL BLUE (TOWEL DISPOSABLE) ×6 IMPLANT
TRAY FOLEY CATH SILVER 14FR (SET/KITS/TRAYS/PACK) ×3 IMPLANT
WATER STERILE IRR 1000ML POUR (IV SOLUTION) ×1 IMPLANT

## 2016-05-16 NOTE — Anesthesia Procedure Notes (Signed)
Procedure Name: Intubation Date/Time: 05/16/2016 1:15 PM Performed by: Tobin Chad Pre-anesthesia Checklist: Emergency Drugs available, Suction available, Patient identified, Patient being monitored and Timeout performed Patient Re-evaluated:Patient Re-evaluated prior to inductionOxygen Delivery Method: Circle system utilized and Simple face mask Preoxygenation: Pre-oxygenation with 100% oxygen Intubation Type: IV induction Ventilation: Mask ventilation without difficulty and Two handed mask ventilation required Grade View: Grade II Tube size: 7.0 mm Number of attempts: 1 Airway Equipment and Method: Stylet Secured at: 22 cm Tube secured with: Tape Dental Injury: Teeth and Oropharynx as per pre-operative assessment

## 2016-05-16 NOTE — H&P (Signed)
Abigail Brewer is an 50 y.o. female  P1 (grown up daughter) here today because she is finally ready to have her hysterectomy. She has bled for the last 6 weeks. She finally stopped about 2 days ago. She is somewhat dizzy at times. She is taking her iron. She has had some hot flashes for about 6 months. Her pap is normal, her u/s shows large fibroids.    No LMP recorded (lmp unknown).    Past Medical History:  Diagnosis Date  . Anemia   . Asthma    no problems in 3 years at least  . Fibroid   . Fibroids   . Heart palpitations    occassional  . Numbness and tingling in right hand   . Obesity     History reviewed. No pertinent surgical history.  Family History  Problem Relation Age of Onset  . Hypertension Father   . Hypertension Mother   . Diabetes Mother   . Hypertension Sister     Social History:  reports that she has quit smoking. She has never used smokeless tobacco. She reports that she drinks alcohol. She reports that she uses drugs, including Marijuana.  Allergies: No Known Allergies  No prescriptions prior to admission.    ROS  There were no vitals taken for this visit. Physical Exam  Heart- rrr Lungs- CTAB Abd- benign  No results found for this or any previous visit (from the past 24 hour(s)).  No results found.  Assessment/Plan: Symptomatic fibroids with anemia- plan for TAH/BS/possible removal of ovaries.  She understands the risks of surgery, including, but not to infection, bleeding, DVTs, damage to bowel, bladder, ureters. She wishes to proceed.     Abigail Brewer C Dayan Desa 05/16/2016, 11:08 AM

## 2016-05-16 NOTE — Transfer of Care (Signed)
Immediate Anesthesia Transfer of Care Note  Patient: Abigail Brewer  Procedure(s) Performed: Procedure(s): HYSTERECTOMY ABDOMINAL (Bilateral) SALPINGO OOPHORECTOMY (Bilateral)  Patient Location: PACU  Anesthesia Type:General  Level of Consciousness: awake, alert , oriented and patient cooperative  Airway & Oxygen Therapy: Patient Spontanous Breathing and Patient connected to nasal cannula oxygen  Post-op Assessment: Report given to RN and Post -op Vital signs reviewed and stable  Post vital signs: Reviewed and stable  Last Vitals:  Vitals:   05/16/16 1124  BP: (!) 144/91  Pulse: 74  Resp: 20  Temp: 36.7 C    Last Pain: There were no vitals filed for this visit.    Patients Stated Pain Goal: 3 ([id number])  Complications: No apparent anesthesia complications

## 2016-05-16 NOTE — Op Note (Signed)
05/16/2016  2:41 PM  PATIENT:  Abigail Brewer  50 y.o. female  PRE-OPERATIVE DIAGNOSIS:  cpt - symptomatic fibroids  POST-OPERATIVE DIAGNOSIS:  same  PROCEDURE:  Procedure(s): HYSTERECTOMY ABDOMINAL (Bilateral) SALPINGO OOPHORECTOMY (Bilateral)  SURGEON:  Surgeon(s) and Role:    * Emily Filbert, MD - Primary    * Lavonia Drafts, MD - Assisting   ANESTHESIA:   general  EBL:  Total I/O In: 1400 [I.V.:1400] Out: 600 [Urine:300; Blood:300]  BLOOD ADMINISTERED:none  DRAINS: none   LOCAL MEDICATIONS USED:  MARCAINE     SPECIMEN:  Source of Specimen:  uterus, tubes, and ovaries  DISPOSITION OF SPECIMEN:  PATHOLOGY  COUNTS:  YES  TOURNIQUET:  * No tourniquets in log *  DICTATION: .Dragon Dictation  PLAN OF CARE: Admit to inpatient   PATIENT DISPOSITION:  PACU - hemodynamically stable.   Delay start of Pharmacological VTE agent (>24hrs) due to surgical blood loss or risk of bleeding: not applicable     The risks, benefits, and alternatives of surgery were explained, understood, and accepted. Consents were signed. All questions were answered. She was taken to the operating room and general anesthesia was applied without complication. Her abdomen and vagina were prepped and draped in the usual sterile fashion. A Foley catheter was placed which drained clear urine throughout the case. A transverse incision was made approximately 2 cm above her symphysis pubis after injecting 30 mL of 0.5% marcaine in the subcutaneous tissue. The incision was carried down through the subcutaneous tissue to the fascia. Bleeding encountered was cauterized with the Bovie. The fascia was scored the midline and the fascial incision was extended bilaterally. The pyramidalis muscles were separated in a transverse fashion using electrosurgical technique. Approximately 2 cm of the rectus muscles were separated in a transverse fashion in the midline using electrosurgical technique. Hemostasis was  maintained. The peritoneum was entered with hemostats and the peritoneal incision was extended bilaterally with the Bovie, taking care to avoid bowel and bladder. The patient was placed in Trendelenburg position and her bowel was packed out of the abdominal cavity. The pelvis was inspected. Her very large uterus filled the entire pelvis. I used towel clamps to elevate the uterus out of the incision. Coker clamps were used to elevate the uterus. The round ligaments were identified clamped cut and ligated. A bladder flap was created anteriorly and the bladder was pushed out of the operative site with a moist lap sponge. The infundibulopelvic ligaments were identified bilaterally. They were clamped, cut, and ligated. Excellent hemostasis was noted. 2-0 Vicryl sutures used throughout this case unless otherwise specified. Her ovaries were very small. The uterine vessels were skeletonized, clamped, cut, and doubly ligated. The large uterus was removed.  The remainder of the cervix was separated from its pelvic attachments using the same clamp, cut, ligate technique. Curved Heaney clamps were used to clamp beneath the cervix. The cervix and uterus were removed and sent to pathology. The vaginal cuff was noted to be hemostatic after placing a figure of eight suture. The pelvis was irrigated with 1 L of warm normal saline. All pedicles were noted to be hemostatic. The ureters were noted to be functioning and of normal caliber. The sponges were removed from the pelvis. The rectus muscles were inspected and hemostasis was assured. The fascia was closed with a 0 Vicryl running nonlocking suture. The subcutaneous tissue was irrigated, clean, dry.  A subcuticular closure was done with 3-0 vicryl suture. She tolerated the procedure well and was  taken to the recovery room in stable condition. Her Foley catheter drained clear urine throughout.

## 2016-05-16 NOTE — Anesthesia Preprocedure Evaluation (Signed)
Anesthesia Evaluation  Patient identified by MRN, date of birth, ID band Patient awake    Reviewed: Allergy & Precautions, NPO status , Patient's Chart, lab work & pertinent test results  Airway Mallampati: I  TM Distance: >3 FB Neck ROM: Full    Dental  (+) Teeth Intact, Dental Advisory Given   Pulmonary former smoker,  breath sounds clear to auscultation        Cardiovascular Rhythm:Regular Rate:Normal     Neuro/Psych    GI/Hepatic   Endo/Other    Renal/GU      Musculoskeletal   Abdominal   Peds  Hematology   Anesthesia Other Findings   Reproductive/Obstetrics                             Anesthesia Physical Anesthesia Plan  ASA: I  Anesthesia Plan: General   Post-op Pain Management:    Induction: Intravenous  Airway Management Planned: Oral ETT  Additional Equipment:   Intra-op Plan:   Post-operative Plan: Extubation in OR  Informed Consent: I have reviewed the patients History and Physical, chart, labs and discussed the procedure including the risks, benefits and alternatives for the proposed anesthesia with the patient or authorized representative who has indicated his/her understanding and acceptance.   Dental advisory given  Plan Discussed with: CRNA, Anesthesiologist and Surgeon  Anesthesia Plan Comments:         Anesthesia Quick Evaluation  

## 2016-05-17 ENCOUNTER — Encounter (HOSPITAL_COMMUNITY): Payer: Self-pay | Admitting: Obstetrics & Gynecology

## 2016-05-17 LAB — CBC
HEMATOCRIT: 31 % — AB (ref 36.0–46.0)
Hemoglobin: 10.8 g/dL — ABNORMAL LOW (ref 12.0–15.0)
MCH: 29.1 pg (ref 26.0–34.0)
MCHC: 34.8 g/dL (ref 30.0–36.0)
MCV: 83.6 fL (ref 78.0–100.0)
PLATELETS: 253 10*3/uL (ref 150–400)
RBC: 3.71 MIL/uL — ABNORMAL LOW (ref 3.87–5.11)
RDW: 13.4 % (ref 11.5–15.5)
WBC: 8.3 10*3/uL (ref 4.0–10.5)

## 2016-05-17 MED ORDER — MUPIROCIN CALCIUM 2 % EX CREA
TOPICAL_CREAM | Freq: Three times a day (TID) | CUTANEOUS | Status: DC
  Administered 2016-05-17: 1 via TOPICAL
  Administered 2016-05-17 – 2016-05-18 (×3): via TOPICAL
  Filled 2016-05-17: qty 15

## 2016-05-17 NOTE — Progress Notes (Signed)
1 Day Post-Op Procedure(s) (LRB): HYSTERECTOMY ABDOMINAL (Bilateral) SALPINGO OOPHORECTOMY (Bilateral)  Subjective: Patient reports tolerating PO and no problems voiding.    Objective: I have reviewed patient's vital signs, intake and output, medications and labs.  General: alert Resp: clear to auscultation bilaterally Cardio: regular rate and rhythm, S1, S2 normal, no murmur, click, rub or gallop GI: soft, non-tender; bowel sounds normal; no masses,  no organomegaly and incision: clean, dry and intact Vaginal Bleeding: none  Her skin was blistering around the edges of the honeycomb. I removed this as well as the steristrips.  Assessment: s/p Procedure(s): HYSTERECTOMY ABDOMINAL (Bilateral) SALPINGO OOPHORECTOMY (Bilateral): stable  Plan: Advance diet Encourage ambulation Discontinue IV fluids  LOS: 1 day    Abigail Brewer 05/17/2016, 12:23 PM

## 2016-05-17 NOTE — Progress Notes (Signed)
Patient has ambulated in hall, three laps.  Post cath removal void 400 ml, clear, amber. No active bleeding.

## 2016-05-17 NOTE — Anesthesia Postprocedure Evaluation (Signed)
Anesthesia Post Note  Patient: Abigail Brewer  Procedure(s) Performed: Procedure(s) (LRB): HYSTERECTOMY ABDOMINAL (Bilateral) SALPINGO OOPHORECTOMY (Bilateral)  Patient location during evaluation: [facility] Anesthesia Type: General Level of consciousness: awake and alert, oriented and patient cooperative Pain management: pain level controlled Vital Signs Assessment: post-procedure vital signs reviewed and stable Respiratory status: spontaneous breathing, nonlabored ventilation and respiratory function stable Cardiovascular status: stable Postop Assessment: no headache, no backache, patient able to bend at knees, no signs of nausea or vomiting and adequate PO intake Anesthetic complications: no Comments: Patient uncomfortable with chronic back pain and left leg pain from prior knee surgery and surgical incision pain.  Discussed pain management options and patient agrees to call RN for pain medication coverage.     Last Vitals:  Vitals:   05/17/16 0200 05/17/16 0613  BP: 118/74 (!) 112/59  Pulse: 76 70  Resp: 18 18  Temp: 36.9 C 36.7 C    Last Pain:  Vitals:   05/17/16 0804  TempSrc:   PainSc: Asleep   Pain Goal: Patients Stated Pain Goal: 3 (05/17/16 0804)  Pain level: 6               [name]

## 2016-05-18 MED ORDER — IBUPROFEN 800 MG PO TABS: 800.0000 mg | ORAL_TABLET | Freq: Three times a day (TID) | ORAL | 0 refills | Status: DC | PRN

## 2016-05-18 MED ORDER — OXYCODONE-ACETAMINOPHEN 5-325 MG PO TABS: 1.0000 | ORAL_TABLET | ORAL | 0 refills | Status: AC | PRN

## 2016-05-18 NOTE — Discharge Summary (Signed)
Physician Discharge Summary  Patient ID: Abigail Brewer MRN: [id number] DOB/AGE: 09/30/1965 50 y.o.  Admit date: 05/16/2016 Discharge date: 05/18/2016  Admission Diagnoses: symptomatic fibroids  Discharge Diagnoses: same Active Problems:   Post-operative state   Discharged Condition: good  Hospital Course: She underwent an uncomplicated TAH/BSO. Her post op hbg was 10.8. She was ambulating, voiding, tolerating po well, and voicing her readiness to go home.  Consults: None  Significant Diagnostic Studies: labs: as above  Treatments: surgery: as above  Discharge Exam: Blood pressure (!) 117/59, pulse 77, temperature 98.3 F (36.8 C), temperature source Oral, resp. rate 16, height 5\' 3"  (1.6 m), weight 79.4 kg (175 lb), SpO2 100 %. General appearance: alert Resp: clear to auscultation bilaterally Cardio: regular rate and rhythm, S1, S2 normal, no murmur, click, rub or gallop GI: soft, non-tender; bowel sounds normal; no masses,  no organomegaly Incision/Wound: c/d/i  Disposition: 01-Home or Self Care     Medication List    STOP taking these medications   predniSONE 10 MG tablet Commonly known as:  DELTASONE     TAKE these medications   albuterol 108 (90 Base) MCG/ACT inhaler Commonly known as:  PROVENTIL HFA;VENTOLIN HFA Inhale 1-2 puffs into the lungs every 6 (six) hours as needed for wheezing or shortness of breath.   BIOTIN PO Take 1 tablet by mouth every morning.   ferrous sulfate 325 (65 FE) MG tablet Take 1 tablet (325 mg total) by mouth 2 (two) times daily.   ibuprofen 800 MG tablet Commonly known as:  ADVIL,MOTRIN Take 1 tablet (800 mg total) by mouth every 8 (eight) hours as needed (mild pain).   oxyCODONE-acetaminophen 5-325 MG tablet Commonly known as:  PERCOCET/ROXICET Take 1-2 tablets by mouth every 4 (four) hours as needed for severe pain (moderate to severe pain (when tolerating fluids)).      Follow-up Information    Emily Filbert, MD.  Schedule an appointment as soon as possible for a visit in 6 week(s).   Specialty:  Obstetrics and Gynecology Contact information: [place] [address] [phone]           Signed: Emily Filbert 05/18/2016, 6:06 PM

## 2016-05-18 NOTE — Progress Notes (Signed)
Pt discharged home ambulatory in stable condition accompanied off unit by family.  Discharge instructions and Rx reviewed and given with pt understanding verbalized.

## 2016-05-18 NOTE — Discharge Instructions (Signed)
Abdominal Hysterectomy Abdominal hysterectomy is a surgery to remove your womb (uterus). Your womb is the part of your body that contains a growing baby. The surgery may be done for many reasons. These may include cancer, growths (tumors), long-term pain, or bleeding. You may also need other reproductive parts removed during this surgery. This will depend on why you need to have the surgery. BEFORE THE PROCEDURE  Talk to your doctor about the changes to your body. These changes may be physical and emotional.  You may need to have blood work done. You may also need X-rays done.  Quit smoking if you smoke. Ask your doctor for help.  Stop taking medicines that thin your blood as told by your doctor.  Your doctor may have you take other medicines. Take all medicines as told by your doctor.  Do not eat or drink anything for 6-8 hours before surgery.  Take your normal medicines with a small sip of water.  Shower or take a bath the night or morning before surgery. PROCEDURE  This surgery is done in the hospital.  You are given a medicine that makes you go to sleep (general anesthetic).  The doctor will make a cut (incision) through the skin in your lower belly.  The cut may be about 5-7 inches long. It may go side-to-side or up-and-down.  The doctor will move the body tissue that covers your womb. The doctor will carefully remove your womb. The doctor may remove any other reproductive parts that need to be removed.  The doctor will use clamps or stitches (sutures) to control bleeding.  The doctor will close your cut with stitches or metal clips. AFTER THE PROCEDURE  You will have pain right after the procedure.  You will be given pain medicine in the recovery room.  You will be taken to your hospital room after the medicines that made you go to sleep wear off.  You will be told how to take care of yourself at home.   This information is not intended to replace advice given to you  by your health care provider. Make sure you discuss any questions you have with your health care provider.   Document Released: 08/05/2013 Document Reviewed: 08/05/2013 Elsevier Interactive Patient Education [employer].

## 2016-06-08 ENCOUNTER — Encounter: Payer: Self-pay | Admitting: *Deleted

## 2016-06-08 ENCOUNTER — Other Ambulatory Visit: Payer: Self-pay | Admitting: Obstetrics & Gynecology

## 2016-06-08 DIAGNOSIS — N938 Other specified abnormal uterine and vaginal bleeding: Secondary | ICD-10-CM

## 2016-06-19 ENCOUNTER — Encounter: Payer: Self-pay | Admitting: *Deleted

## 2016-06-30 ENCOUNTER — Encounter: Payer: Self-pay | Admitting: Obstetrics & Gynecology

## 2016-06-30 ENCOUNTER — Encounter: Payer: Self-pay | Admitting: Family Medicine

## 2016-06-30 ENCOUNTER — Ambulatory Visit: Payer: Self-pay | Admitting: Obstetrics & Gynecology

## 2016-06-30 VITALS — BP 132/91 | HR 78 | Wt 170.9 lb

## 2016-06-30 DIAGNOSIS — Z9889 Other specified postprocedural states: Secondary | ICD-10-CM

## 2016-06-30 DIAGNOSIS — F329 Major depressive disorder, single episode, unspecified: Secondary | ICD-10-CM

## 2016-06-30 LAB — POCT URINALYSIS DIP (DEVICE)
Bilirubin Urine: NEGATIVE
Glucose, UA: NEGATIVE mg/dL
Hgb urine dipstick: NEGATIVE
KETONES UR: NEGATIVE mg/dL
Leukocytes, UA: NEGATIVE
Nitrite: NEGATIVE
PH: 7 (ref 5.0–8.0)
PROTEIN: NEGATIVE mg/dL
SPECIFIC GRAVITY, URINE: 1.02 (ref 1.005–1.030)
UROBILINOGEN UA: 0.2 mg/dL (ref 0.0–1.0)

## 2016-06-30 NOTE — Progress Notes (Signed)
   Subjective:    Patient ID: Abigail Brewer, female    DOB: 06-19-1966, 50 y.o.   MRN: [id number]  HPI 50 yo S AA lady here for her 6 week post op visit after a TAH/BS. Physically she is doing well. No sex yet (same sex preference). Feels some "heaviness" with voiding. Normal BMs. She is having a lot of stress (grandson locked up last night).   Review of Systems     Objective:   Physical Exam  WNWHBFNAD (although she had been crying with the nurse- about her grandson) Abd- benign Incision- healed well Cuff- healed well Bimanual exam normal, no masses or tenderness      Assessment & Plan:  Post op- doing well Check  Ua, culture prn Andris Baumann will call her Monday  RTC 1 year/prn sooner

## 2016-07-05 ENCOUNTER — Telehealth: Payer: Self-pay | Admitting: Clinical

## 2016-07-05 NOTE — Telephone Encounter (Signed)
Follow-up call to check on patient. Pt aware she may come in to speak further with [facility]; she is currently leaving to work second job, and will call back with appointment, as needed.

## 2016-10-03 ENCOUNTER — Other Ambulatory Visit: Payer: Self-pay | Admitting: Obstetrics & Gynecology

## 2016-10-04 ENCOUNTER — Emergency Department
Admission: EM | Admit: 2016-10-04 | Discharge: 2016-10-04 | Disposition: A | Discharge status: Home / Self Care | Payer: Self-pay | Attending: Emergency Medicine | Admitting: Emergency Medicine

## 2016-10-04 ENCOUNTER — Emergency Department: Payer: Self-pay

## 2016-10-04 DIAGNOSIS — R0789 Other chest pain: Secondary | ICD-10-CM | POA: Insufficient documentation

## 2016-10-04 DIAGNOSIS — J45909 Unspecified asthma, uncomplicated: Secondary | ICD-10-CM | POA: Insufficient documentation

## 2016-10-04 DIAGNOSIS — M549 Dorsalgia, unspecified: Secondary | ICD-10-CM | POA: Insufficient documentation

## 2016-10-04 DIAGNOSIS — Z87891 Personal history of nicotine dependence: Secondary | ICD-10-CM | POA: Insufficient documentation

## 2016-10-04 DIAGNOSIS — R079 Chest pain, unspecified: Secondary | ICD-10-CM

## 2016-10-04 LAB — CBC
HCT: 40.2 % (ref 35.0–47.0)
Hemoglobin: 14.2 g/dL (ref 12.0–16.0)
MCH: 29.1 pg (ref 26.0–34.0)
MCHC: 35.4 g/dL (ref 32.0–36.0)
MCV: 82.2 fL (ref 80.0–100.0)
PLATELETS: 236 10*3/uL (ref 150–440)
RBC: 4.89 MIL/uL (ref 3.80–5.20)
RDW: 13.9 % (ref 11.5–14.5)
WBC: 3.3 10*3/uL — ABNORMAL LOW (ref 3.6–11.0)

## 2016-10-04 LAB — BASIC METABOLIC PANEL
Anion gap: 8 (ref 5–15)
BUN: 13 mg/dL (ref 6–20)
CHLORIDE: 103 mmol/L (ref 101–111)
CO2: 28 mmol/L (ref 22–32)
CREATININE: 0.75 mg/dL (ref 0.44–1.00)
Calcium: 9.5 mg/dL (ref 8.9–10.3)
GFR calc non Af Amer: >60 mL/min (ref >60)
GLUCOSE: 118 mg/dL — AB (ref 65–99)
Potassium: 3.5 mmol/L (ref 3.5–5.1)
Sodium: 139 mmol/L (ref 135–145)

## 2016-10-04 LAB — TROPONIN I
Troponin I: <0.03 ng/mL (ref <0.03)
Troponin I: <0.03 ng/mL (ref <0.03)

## 2016-10-04 MED ORDER — KETOROLAC TROMETHAMINE 60 MG/2ML IM SOLN
60.0000 mg | Freq: Once | INTRAMUSCULAR | Status: AC
  Administered 2016-10-04: 60 mg via INTRAMUSCULAR
  Filled 2016-10-04: qty 2

## 2016-10-04 NOTE — ED Provider Notes (Signed)
[facility] Emergency Department Provider Note  ____________________________________________   I have reviewed the triage vital signs and the nursing notes.   HISTORY  Chief Complaint Chest Pain   History limited by: Not Limited   HPI Abigail Brewer is a 51 y.o. female who presents to the emergency department today because of concerns for chest pain. The patient states that the chest pain started when she was at work today. 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 Patient denies any significant lifting today. She states that the pain is also located behind her left scapula. The patient states the pain is slightly worse with deep inspiration. She states it feels somewhat like a pulled muscle. Patient denies any cough. No shortness breath. Patient not on any exogenous estrogen. She denies any recent travel or surgeries. No history of blood clots. No leg swelling.   Past Medical History:  Diagnosis Date  . Anemia   . Asthma    no problems in 3 years at least  . Fibroid   . Fibroids   . Heart palpitations    occassional  . Numbness and tingling in right hand   . Obesity     Patient Active Problem List   Diagnosis Date Noted  . Post-operative state 05/16/2016  . Fibroids 01/13/2015    Past Surgical History:  Procedure Laterality Date  . HYSTERECTOMY ABDOMINAL WITH SALPINGECTOMY Bilateral 05/16/2016   Procedure: HYSTERECTOMY ABDOMINAL;  Surgeon: Emily Filbert, MD;  Location: [facility];  Service: Gynecology;  Laterality: Bilateral;  . SALPINGOOPHORECTOMY Bilateral 05/16/2016   Procedure: SALPINGO OOPHORECTOMY;  Surgeon: Emily Filbert, MD;  Location: [facility];  Service: Gynecology;  Laterality: Bilateral;    Prior to Admission medications   Medication Sig Start Date End Date Taking? Authorizing Provider  albuterol (PROVENTIL HFA;VENTOLIN HFA) 108 (90 Base) MCG/ACT inhaler Inhale 1-2 puffs into the lungs every 6 (six) hours as needed for wheezing or  shortness of breath.    Historical Provider, MD  BIOTIN PO Take 1 tablet by mouth every morning.    Historical Provider, MD  ferrous sulfate 325 (65 FE) MG tablet TAKE 1 TABLET BY MOUTH TWICE A DAY 08/17/16   Emily Filbert, MD  ferrous sulfate 325 (65 FE) MG tablet TAKE 1 TABLET (325 MG TOTAL) BY MOUTH 2 (TWO) TIMES DAILY. 08/17/16   Emily Filbert, MD  ibuprofen (ADVIL,MOTRIN) 800 MG tablet Take 1 tablet (800 mg total) by mouth every 8 (eight) hours as needed (mild pain). Patient not taking: Reported on 06/30/2016 05/18/16   Emily Filbert, MD  oxyCODONE-acetaminophen (PERCOCET/ROXICET) 5-325 MG tablet Take 1-2 tablets by mouth every 4 (four) hours as needed for severe pain (moderate to severe pain (when tolerating fluids)). Patient not taking: Reported on 06/30/2016 05/18/16   Emily Filbert, MD  vitamin E 1000 UNIT capsule Take 1,000 Units by mouth daily.    Historical Provider, MD    Allergies Patient has no known allergies.  Family History  Problem Relation Age of Onset  . Hypertension Father   . Hypertension Mother   . Diabetes Mother   . Hypertension Sister     Social History Social History  Substance Use Topics  . Smoking status: Former Research scientist (life sciences)  . Smokeless tobacco: Never Used  . Alcohol use Yes     Comment: occasionally    Review of Systems  Constitutional: Negative for fever. Cardiovascular: Positive for left chest pain. Respiratory: Negative for shortness of breath. Gastrointestinal: Negative  for abdominal pain, vomiting and diarrhea. Genitourinary: Negative for dysuria. Musculoskeletal: Positive for left back pain. Skin: Negative for rash. Neurological: Negative for headaches, focal weakness or numbness.  10-point ROS otherwise negative.  ____________________________________________   PHYSICAL EXAM:  VITAL SIGNS: ED Triage Vitals  Enc Vitals Group     BP 10/04/16 1214 129/86     Pulse Rate 10/04/16 1214 77     Resp 10/04/16 1214 18     Temp 10/04/16 1214 98.3 F (36.8  C)     Temp Source 10/04/16 1214 Oral     SpO2 10/04/16 1214 100 %     Weight 10/04/16 1214 170 lb (77.1 kg)     Height --      Head Circumference --      Peak Flow --      Pain Score 10/04/16 1215 7   Constitutional: Alert and oriented. Well appearing and in no distress. Eyes: Conjunctivae are normal. Normal extraocular movements. ENT   Head: Normocephalic and atraumatic.   Nose: No congestion/rhinnorhea.   Mouth/Throat: Mucous membranes are moist.   Neck: No stridor. Hematological/Lymphatic/Immunilogical: No cervical lymphadenopathy. Cardiovascular: Normal rate, regular rhythm.  No murmurs, rubs, or gallops.  Respiratory: Normal respiratory effort without tachypnea nor retractions. Breath sounds are clear and equal bilaterally. No wheezes/rales/rhonchi. Gastrointestinal: Soft and non tender. No rebound. No guarding.  Genitourinary: Deferred Musculoskeletal: Normal range of motion in all extremities. No lower extremity edema. Neurologic:  Normal speech and language. No gross focal neurologic deficits are appreciated.  Skin:  Skin is warm, dry and intact. No rash noted. Psychiatric: Mood and affect are normal. Speech and behavior are normal. Patient exhibits appropriate insight and judgment.  ____________________________________________    LABS (pertinent positives/negatives)  Labs Reviewed  BASIC METABOLIC PANEL - Abnormal; Notable for the following:       Result Value   Glucose, Bld 118 (*)    All other components within normal limits  CBC - Abnormal; Notable for the following:    WBC 3.3 (*)    All other components within normal limits  TROPONIN I  TROPONIN I     ____________________________________________   EKG  I, Nance Pear, attending physician, personally viewed and interpreted this EKG  EKG Time: 1209 Rate: 71 Rhythm: normal sinus rhythm Axis: left axis deviation Intervals: qtc 412 QRS: LVH ST changes: no st elevation Impression:  abnormal ekg   ____________________________________________    RADIOLOGY  CXR IMPRESSION:  There is no pneumonia nor CHF. Mild hyperinflation is consistent  with the history of reactive airway disease and previous smoking.     ____________________________________________   PROCEDURES  Procedures  ____________________________________________   INITIAL IMPRESSION / ASSESSMENT AND PLAN / ED COURSE  Pertinent labs & imaging results that were available during my care of the patient were reviewed by me and considered in my medical decision making (see chart for details).  Patient presented to the emergency department today because of concerns for central chest pain. Sets of troponin negative. Chest x-ray without any pneumonia or pneumothorax. No widened mediastinum. PERC negative. At this point think possible musculoskeletal source of pain. Will discharge patient. Return precautions.  ____________________________________________   FINAL CLINICAL IMPRESSION(S) / ED DIAGNOSES  Final diagnoses:  Nonspecific chest pain     Note: This dictation was prepared with Dragon dictation. Any transcriptional errors that result from this process are unintentional     Nance Pear, MD 10/04/16 2009

## 2016-10-04 NOTE — ED Notes (Signed)
AAOx3.  Skin warm and dry.  NAD 

## 2016-10-04 NOTE — ED Triage Notes (Signed)
Pain with deep breathing to left side of chest that began while at work today. Pt alert and oriented X4, active, cooperative, pt in NAD. RR even and unlabored, color WNL.

## 2016-10-04 NOTE — Discharge Instructions (Signed)
Please seek medical attention for any high fevers, chest pain, shortness of breath, change in behavior, persistent vomiting, bloody stool or any other new or concerning symptoms.  

## 2016-11-25 ENCOUNTER — Other Ambulatory Visit: Payer: Self-pay | Admitting: Obstetrics & Gynecology

## 2016-11-25 DIAGNOSIS — N938 Other specified abnormal uterine and vaginal bleeding: Secondary | ICD-10-CM

## 2017-01-17 ENCOUNTER — Other Ambulatory Visit (HOSPITAL_COMMUNITY): Payer: Self-pay | Admitting: Obstetrics & Gynecology

## 2017-01-17 DIAGNOSIS — Z1231 Encounter for screening mammogram for malignant neoplasm of breast: Secondary | ICD-10-CM

## 2017-04-06 ENCOUNTER — Ambulatory Visit
Admission: RE | Admit: 2017-04-06 | Discharge: 2017-04-06 | Disposition: A | Discharge status: Home / Self Care | Payer: No Typology Code available for payment source | Source: Ambulatory Visit | Attending: Obstetrics & Gynecology | Admitting: Obstetrics & Gynecology

## 2017-04-06 DIAGNOSIS — Z1231 Encounter for screening mammogram for malignant neoplasm of breast: Secondary | ICD-10-CM

## 2017-07-16 ENCOUNTER — Other Ambulatory Visit: Payer: Self-pay | Admitting: Obstetrics & Gynecology

## 2017-07-16 DIAGNOSIS — N938 Other specified abnormal uterine and vaginal bleeding: Secondary | ICD-10-CM

## 2017-11-03 ENCOUNTER — Other Ambulatory Visit: Payer: Self-pay | Admitting: Obstetrics & Gynecology

## 2017-11-03 DIAGNOSIS — N938 Other specified abnormal uterine and vaginal bleeding: Secondary | ICD-10-CM

## 2017-12-31 ENCOUNTER — Other Ambulatory Visit: Payer: Self-pay

## 2017-12-31 DIAGNOSIS — Z1231 Encounter for screening mammogram for malignant neoplasm of breast: Secondary | ICD-10-CM

## 2018-01-16 ENCOUNTER — Other Ambulatory Visit (HOSPITAL_COMMUNITY): Payer: Self-pay | Admitting: Obstetrics & Gynecology

## 2018-01-16 DIAGNOSIS — Z1231 Encounter for screening mammogram for malignant neoplasm of breast: Secondary | ICD-10-CM

## 2018-04-02 ENCOUNTER — Other Ambulatory Visit: Payer: Self-pay | Admitting: Obstetrics & Gynecology

## 2018-04-02 DIAGNOSIS — N938 Other specified abnormal uterine and vaginal bleeding: Secondary | ICD-10-CM

## 2018-04-09 IMAGING — CR DG CHEST 2V
2 series · 2 of 2 positions shown · non-contrast
Comparison: Chest x-ray of August 17, 2012

CLINICAL DATA: Left-sided pleuritic chest pain began today at work.
History of asthma, former smoker.

EXAM:
CHEST  2 VIEW

[chest pa]
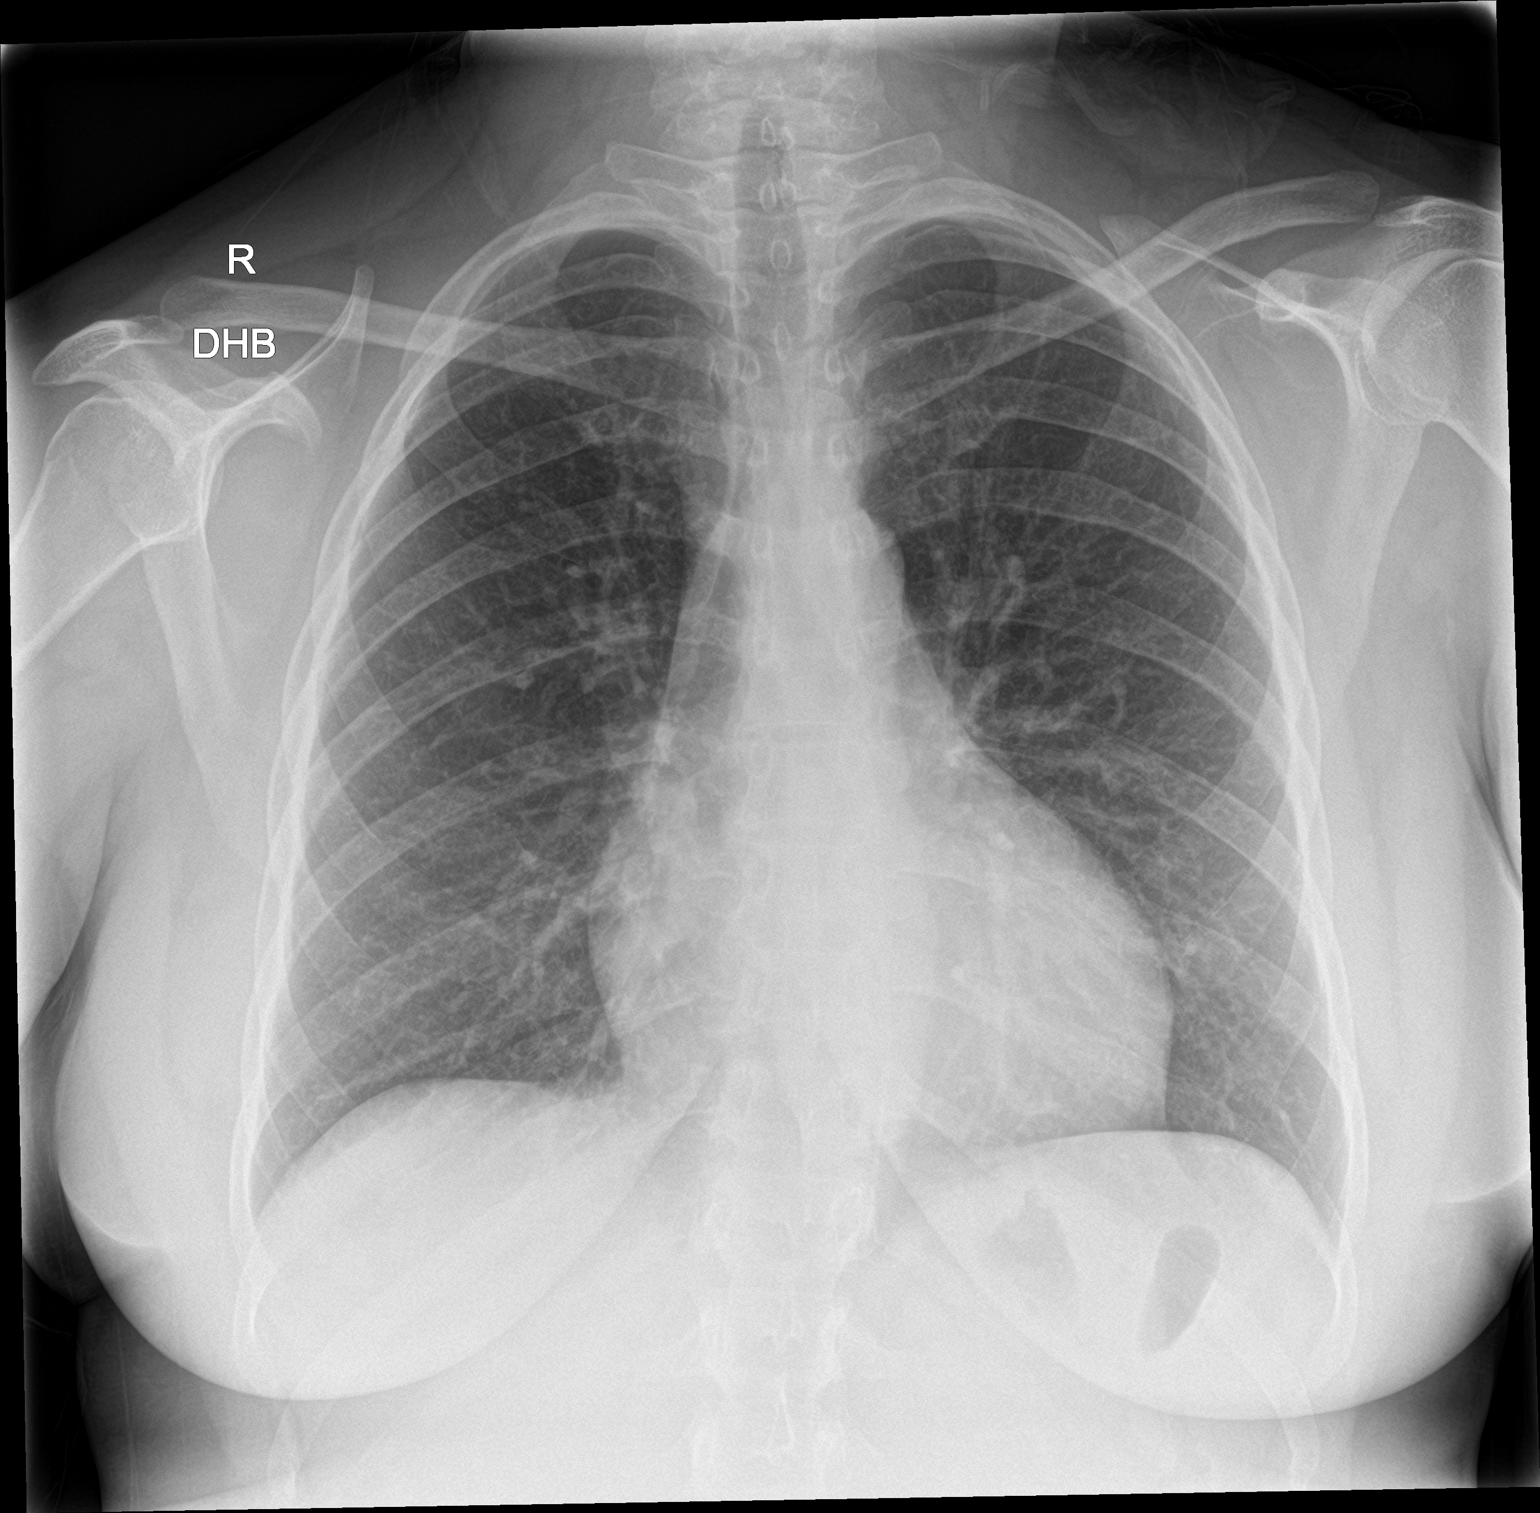

[chest lat]
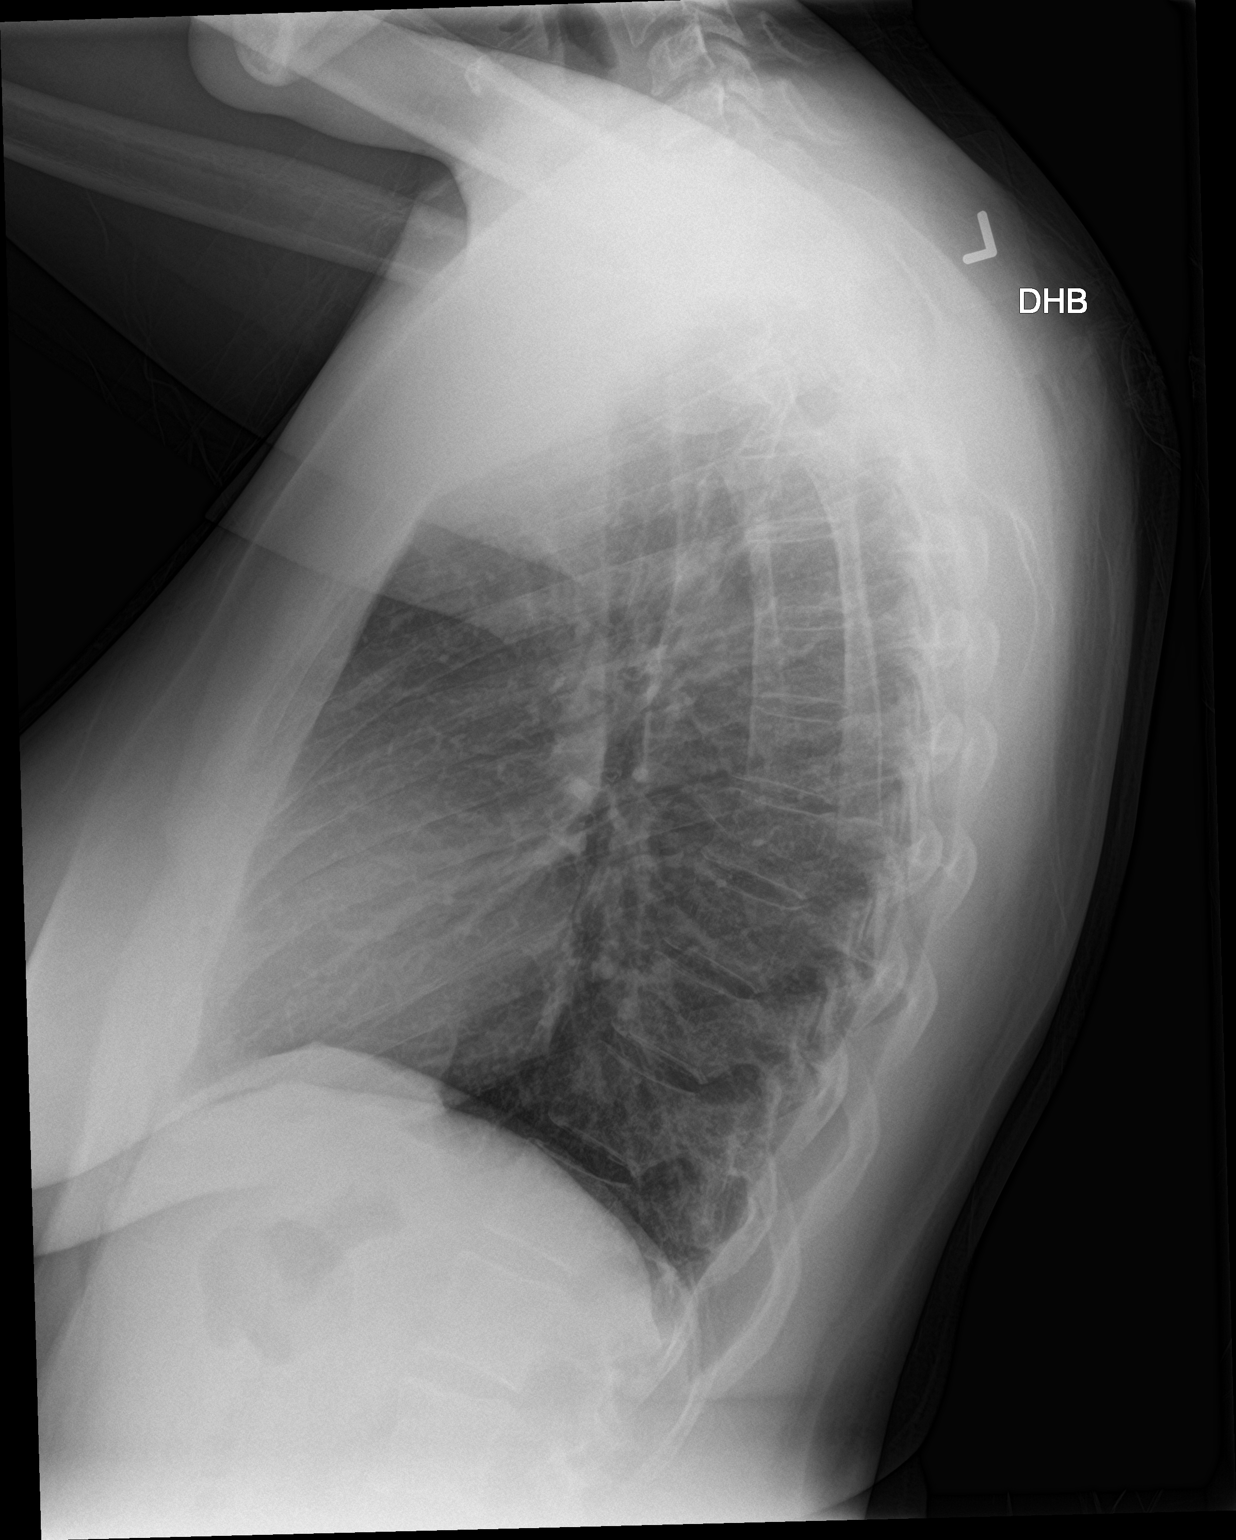

[2 of 2 positions shown; findings below may reference images not displayed]

FINDINGS: The lungs are well-expanded and clear. There is no alveolar
infiltrate or pleural effusion. There is no pneumothorax or
pneumomediastinum. The heart and pulmonary vascularity are normal.
The mediastinum is normal in width. The bony thorax is unremarkable.
IMPRESSION: There is no pneumonia nor CHF. Mild hyperinflation is consistent
with the history of reactive airway disease and previous smoking.

## 2018-05-31 ENCOUNTER — Telehealth (HOSPITAL_COMMUNITY): Payer: Self-pay

## 2018-05-31 NOTE — Telephone Encounter (Signed)
Did not schedule mammo was saying it was to far to drive. So I tried to reach her back regarding [facility]. I left a voicemail with the number to call.

## 2018-08-24 ENCOUNTER — Other Ambulatory Visit: Payer: Self-pay | Admitting: Obstetrics & Gynecology

## 2018-08-24 DIAGNOSIS — N938 Other specified abnormal uterine and vaginal bleeding: Secondary | ICD-10-CM

## 2018-10-25 ENCOUNTER — Emergency Department: Payer: No Typology Code available for payment source

## 2018-10-25 ENCOUNTER — Other Ambulatory Visit: Payer: Self-pay | Admitting: Obstetrics & Gynecology

## 2018-10-25 ENCOUNTER — Encounter: Payer: Self-pay | Admitting: Emergency Medicine

## 2018-10-25 ENCOUNTER — Other Ambulatory Visit: Payer: Self-pay

## 2018-10-25 DIAGNOSIS — N938 Other specified abnormal uterine and vaginal bleeding: Secondary | ICD-10-CM

## 2018-10-25 DIAGNOSIS — R0789 Other chest pain: Secondary | ICD-10-CM | POA: Insufficient documentation

## 2018-10-25 DIAGNOSIS — F1210 Cannabis abuse, uncomplicated: Secondary | ICD-10-CM | POA: Insufficient documentation

## 2018-10-25 DIAGNOSIS — J45909 Unspecified asthma, uncomplicated: Secondary | ICD-10-CM | POA: Insufficient documentation

## 2018-10-25 DIAGNOSIS — Z87891 Personal history of nicotine dependence: Secondary | ICD-10-CM | POA: Insufficient documentation

## 2018-10-25 DIAGNOSIS — Z79899 Other long term (current) drug therapy: Secondary | ICD-10-CM | POA: Insufficient documentation

## 2018-10-25 DIAGNOSIS — R0981 Nasal congestion: Secondary | ICD-10-CM | POA: Insufficient documentation

## 2018-10-25 DIAGNOSIS — R05 Cough: Secondary | ICD-10-CM | POA: Insufficient documentation

## 2018-10-25 LAB — BASIC METABOLIC PANEL
ANION GAP: 9 (ref 5–15)
BUN: 14 mg/dL (ref 6–20)
CO2: 26 mmol/L (ref 22–32)
Calcium: 9.5 mg/dL (ref 8.9–10.3)
Chloride: 104 mmol/L (ref 98–111)
Creatinine, Ser: 0.63 mg/dL (ref 0.44–1.00)
GFR calc Af Amer: >60 mL/min (ref >60)
GLUCOSE: 150 mg/dL — AB (ref 70–99)
Potassium: 3.5 mmol/L (ref 3.5–5.1)
Sodium: 139 mmol/L (ref 135–145)

## 2018-10-25 LAB — CBC
HEMATOCRIT: 40.9 % (ref 36.0–46.0)
HEMOGLOBIN: 14.1 g/dL (ref 12.0–15.0)
MCH: 29.6 pg (ref 26.0–34.0)
MCHC: 34.5 g/dL (ref 30.0–36.0)
MCV: 85.7 fL (ref 80.0–100.0)
Platelets: 242 10*3/uL (ref 150–400)
RBC: 4.77 MIL/uL (ref 3.87–5.11)
RDW: 12 % (ref 11.5–15.5)
WBC: 6.8 10*3/uL (ref 4.0–10.5)
nRBC: 0 % (ref 0.0–0.2)

## 2018-10-25 LAB — TROPONIN I

## 2018-10-25 NOTE — ED Triage Notes (Signed)
Pt arrives with complaints of pain on the left side of her chest. Pt reports the pain as a hard/swollen discomfort. Pt reports the pain is worse with movement. Pt states she also has head congestion & cough. Pt states all her symptoms started "2 months ago when a person at work coughed on me." Pt reports constant URI symptoms that have not been relieved with OTC medications.

## 2018-10-26 ENCOUNTER — Emergency Department
Admission: EM | Admit: 2018-10-26 | Discharge: 2018-10-26 | Disposition: A | Discharge status: Home / Self Care | Payer: No Typology Code available for payment source | Attending: Emergency Medicine | Admitting: Emergency Medicine

## 2018-10-26 DIAGNOSIS — R0789 Other chest pain: Secondary | ICD-10-CM

## 2018-10-26 MED ORDER — HYDROCOD POLST-CPM POLST ER 10-8 MG/5ML PO SUER
5.0000 mL | Freq: Once | ORAL | Status: AC
  Administered 2018-10-26: 5 mL via ORAL
  Filled 2018-10-26: qty 5

## 2018-10-26 MED ORDER — METHYLPREDNISOLONE 4 MG PO TBPK: ORAL_TABLET | ORAL | 0 refills | Status: AC

## 2018-10-26 MED ORDER — HYDROCOD POLST-CPM POLST ER 10-8 MG/5ML PO SUER: 5.0000 mL | Freq: Two times a day (BID) | ORAL | 0 refills | Status: AC

## 2018-10-26 MED ORDER — IBUPROFEN 800 MG PO TABS: 800.0000 mg | ORAL_TABLET | Freq: Three times a day (TID) | ORAL | 0 refills | Status: AC | PRN

## 2018-10-26 MED ORDER — PREDNISONE 20 MG PO TABS
30.0000 mg | ORAL_TABLET | Freq: Once | ORAL | Status: AC
  Administered 2018-10-26: 02:00:00 30 mg via ORAL
  Filled 2018-10-26: qty 1

## 2018-10-26 MED ORDER — IBUPROFEN 800 MG PO TABS
800.0000 mg | ORAL_TABLET | Freq: Once | ORAL | Status: AC
  Administered 2018-10-26: 800 mg via ORAL
  Filled 2018-10-26: qty 1

## 2018-10-26 NOTE — ED Notes (Signed)
Provided pt with [facility] for follow up pt verbalizes understanding of discharge instructions

## 2018-10-26 NOTE — ED Provider Notes (Signed)
[facility] Emergency Department Provider Note   ____________________________________________   First MD Initiated Contact with Patient 10/26/18 0130     (approximate)  I have reviewed the triage vital signs and the nursing notes.   HISTORY  Chief Complaint Chest Pain    HPI Abigail Brewer is a 53 y.o. female who presents to the ED from home with a chief complaint of left chest pain.  Patient states she has been having cough and congestion for the last 2 months.  Complains of pain underneath her left breast.  Feels that area is swollen and worse with movement or cough.  Denies associated fever, chills, abdominal pain, nausea, vomiting.  Denies recent travel, trauma or hormone use.       Past Medical History:  Diagnosis Date  . Anemia   . Asthma    no problems in 3 years at least  . Fibroid   . Fibroids   . Heart palpitations    occassional  . Numbness and tingling in right hand   . Obesity     Patient Active Problem List   Diagnosis Date Noted  . Post-operative state 05/16/2016  . Fibroids 01/13/2015    Past Surgical History:  Procedure Laterality Date  . HYSTERECTOMY ABDOMINAL WITH SALPINGECTOMY Bilateral 05/16/2016   Procedure: HYSTERECTOMY ABDOMINAL;  Surgeon: Emily Filbert, MD;  Location: [facility];  Service: Gynecology;  Laterality: Bilateral;  . SALPINGOOPHORECTOMY Bilateral 05/16/2016   Procedure: SALPINGO OOPHORECTOMY;  Surgeon: Emily Filbert, MD;  Location: [facility];  Service: Gynecology;  Laterality: Bilateral;    Prior to Admission medications   Medication Sig Start Date End Date Taking? Authorizing Provider  albuterol (PROVENTIL HFA;VENTOLIN HFA) 108 (90 Base) MCG/ACT inhaler Inhale 1-2 puffs into the lungs every 6 (six) hours as needed for wheezing or shortness of breath.    [provider]  BIOTIN PO Take 1 tablet by mouth every morning.    [provider]  chlorpheniramine-HYDROcodone (TUSSIONEX PENNKINETIC ER)  10-8 MG/5ML SUER Take 5 mLs by mouth 2 (two) times daily. 10/26/18   Paulette Blanch, MD  ferrous sulfate 325 (65 FE) MG tablet TAKE 1 TABLET BY MOUTH TWICE A DAY 08/17/16   Dove, Myra C, MD  ferrous sulfate 325 (65 FE) MG tablet TAKE 1 TABLET (325 MG TOTAL) BY MOUTH 2 (TWO) TIMES DAILY. 08/17/16   Emily Filbert, MD  ferrous sulfate 325 (65 FE) MG tablet TAKE 1 TABLET (325 MG TOTAL) BY MOUTH 2 (TWO) TIMES DAILY WITH A MEAL. 11/29/16   Emily Filbert, MD  ferrous sulfate 325 (65 FE) MG tablet TAKE 1 TABLET (325 MG TOTAL) BY MOUTH 2 (TWO) TIMES DAILY. 10/25/18   Woodroe Mode, MD  ibuprofen (ADVIL,MOTRIN) 800 MG tablet Take 1 tablet (800 mg total) by mouth every 8 (eight) hours as needed for moderate pain. 10/26/18   Paulette Blanch, MD  methylPREDNISolone (MEDROL DOSEPAK) 4 MG TBPK tablet Take as directed 10/26/18   Paulette Blanch, MD  oxyCODONE-acetaminophen (PERCOCET/ROXICET) 5-325 MG tablet Take 1-2 tablets by mouth every 4 (four) hours as needed for severe pain (moderate to severe pain (when tolerating fluids)). Patient not taking: Reported on 06/30/2016 05/18/16   Emily Filbert, MD  vitamin E 1000 UNIT capsule Take 1,000 Units by mouth daily.    [provider]    Allergies Patient has no known allergies.  Family History  Problem Relation Age of Onset  . Hypertension Father   .  Hypertension Mother   . Diabetes Mother   . Hypertension Sister     Social History Social History   Tobacco Use  . Smoking status: Former Research scientist (life sciences)  . Smokeless tobacco: Never Used  Substance Use Topics  . Alcohol use: Yes    Comment: occasionally  . Drug use: Yes    Types: Marijuana    Comment: past use    Review of Systems  Constitutional: No fever/chills Eyes: No visual changes. ENT: No sore throat. Cardiovascular: Positive for left chest pain. Respiratory: Positive for dry cough.  Denies shortness of breath. Gastrointestinal: No abdominal pain.  No nausea, no vomiting.  No diarrhea.  No constipation.  Genitourinary: Negative for dysuria. Musculoskeletal: Negative for back pain. Skin: Negative for rash. Neurological: Negative for headaches, focal weakness or numbness.   ____________________________________________   PHYSICAL EXAM:  VITAL SIGNS: ED Triage Vitals  Enc Vitals Group     BP 10/25/18 1849 (!) 153/109     Pulse Rate 10/25/18 1849 83     Resp 10/25/18 1849 16     Temp 10/25/18 1849 98.3 F (36.8 C)     Temp src --      SpO2 10/25/18 1849 98 %     Weight 10/25/18 1851 200 lb (90.7 kg)     Height 10/25/18 1851 5\' 3"  (1.6 m)     Head Circumference --      Peak Flow --      Pain Score 10/25/18 1850 6     Pain Loc --      Pain Edu? --      Excl. in [place]? --     Constitutional: Alert and oriented. Well appearing and in no acute distress. Eyes: Conjunctivae are normal. PERRL. EOMI. Head: Atraumatic. Nose: Congestion/rhinnorhea. Mouth/Throat: Mucous membranes are moist.  Oropharynx non-erythematous. Neck: No stridor.   Cardiovascular: Normal rate, regular rhythm. Grossly normal heart sounds.  Good peripheral circulation. Respiratory: Normal respiratory effort.  No retractions. Lungs CTAB.  Tender to palpation ribs beneath left breast.  No crepitus.  No splinting. Gastrointestinal: Soft and nontender. No distention. No abdominal bruits. No CVA tenderness. Musculoskeletal: No lower extremity tenderness nor edema.  No joint effusions. Neurologic:  Normal speech and language. No gross focal neurologic deficits are appreciated. No gait instability. Skin:  Skin is warm, dry and intact. No rash noted. Psychiatric: Mood and affect are normal. Speech and behavior are normal.  ____________________________________________   LABS (all labs ordered are listed, but only abnormal results are displayed)  Labs Reviewed  BASIC METABOLIC PANEL - Abnormal; Notable for the following components:      Result Value   Glucose, Bld 150 (*)    All other components within normal limits   CBC  TROPONIN I   ____________________________________________  EKG  ED ECG REPORT I, Kasidy Gianino J, the attending physician, personally viewed and interpreted this ECG.   Date: 10/26/2018  EKG Time: 1848  Rate: 78  Rhythm: normal EKG, normal sinus rhythm  Axis: Normal  Intervals:none  ST&T Change: Nonspecific  ____________________________________________  RADIOLOGY  ED MD interpretation: No acute cardiopulmonary process  Official radiology report(s): Dg Chest 2 View  Result Date: 10/25/2018 CLINICAL DATA:  Chest pain EXAM: CHEST - 2 VIEW COMPARISON:  10/04/2016 chest radiograph. FINDINGS: Stable cardiomediastinal silhouette with normal heart size. No pneumothorax. No pleural effusion. Lungs appear clear, with no acute consolidative airspace disease and no pulmonary edema. IMPRESSION: No active cardiopulmonary disease. Electronically Signed   By: Janina Mayo.D.  On: 10/25/2018 19:28    ____________________________________________   PROCEDURES  Procedure(s) performed (including Critical Care):  Procedures   ____________________________________________   INITIAL IMPRESSION / ASSESSMENT AND PLAN / ED COURSE  As part of my medical decision making, I reviewed the following data within the [place] notes reviewed and incorporated, Labs reviewed, EKG interpreted, Old chart reviewed, Radiograph reviewed and Notes from prior ED visits        53 year old female who presents with reproducible pain to left ribs underneath the breast. Differential diagnosis includes, but is not limited to, ACS, aortic dissection, pulmonary embolism, cardiac tamponade, pneumothorax, pneumonia, pericarditis, myocarditis, GI-related causes including esophagitis/gastritis, and musculoskeletal chest wall pain.    EKG and troponin unremarkable.  Will place on NSAIDs, steroid taper and Tussionex as needed.  Strict return precautions given.  Patient verbalizes  understanding agrees with plan of care.      ____________________________________________   FINAL CLINICAL IMPRESSION(S) / ED DIAGNOSES  Final diagnoses:  Chest wall pain     ED Discharge Orders         Ordered    ibuprofen (ADVIL,MOTRIN) 800 MG tablet  Every 8 hours PRN     10/26/18 0138    methylPREDNISolone (MEDROL DOSEPAK) 4 MG TBPK tablet     10/26/18 0138    chlorpheniramine-HYDROcodone (TUSSIONEX PENNKINETIC ER) 10-8 MG/5ML SUER  2 times daily     10/26/18 0138           Note:  This document was prepared using Dragon voice recognition software and may include unintentional dictation errors.   Paulette Blanch, MD 10/26/18 [id number]

## 2018-10-26 NOTE — Discharge Instructions (Signed)
1.  You may take medicines as needed for cough and pain (Motrin/Tussionex). 2.  Take steroid taper as prescribed (Medrol Dosepak). 3.  Apply moist heat to affected area several times daily. 4.  Return to the ER for worsening symptoms, persistent vomiting, difficulty breathing or other concerns.

## 2019-01-18 ENCOUNTER — Other Ambulatory Visit: Payer: Self-pay | Admitting: Obstetrics & Gynecology

## 2019-01-18 DIAGNOSIS — N938 Other specified abnormal uterine and vaginal bleeding: Secondary | ICD-10-CM

## 2019-05-29 ENCOUNTER — Telehealth: Payer: Self-pay | Admitting: *Deleted

## 2019-05-29 DIAGNOSIS — N938 Other specified abnormal uterine and vaginal bleeding: Secondary | ICD-10-CM

## 2019-05-29 MED ORDER — FERROUS SULFATE 325 (65 FE) MG PO TABS
325.0000 mg | ORAL_TABLET | Freq: Two times a day (BID) | ORAL | 0 refills | Status: AC
Start: 2019-05-29 — End: ?

## 2019-05-29 NOTE — Telephone Encounter (Signed)
Received a refill request for iron

## 2019-09-11 ENCOUNTER — Encounter: Payer: Self-pay | Admitting: General Practice

## 2020-06-11 ENCOUNTER — Emergency Department
Admission: EM | Admit: 2020-06-11 | Discharge: 2020-06-12 | Disposition: A | Discharge status: Home / Self Care | Payer: HRSA Program | Attending: Emergency Medicine | Admitting: Emergency Medicine

## 2020-06-11 ENCOUNTER — Emergency Department: Payer: HRSA Program

## 2020-06-11 ENCOUNTER — Other Ambulatory Visit: Payer: Self-pay

## 2020-06-11 DIAGNOSIS — J1282 Pneumonia due to coronavirus disease 2019: Secondary | ICD-10-CM

## 2020-06-11 DIAGNOSIS — R0602 Shortness of breath: Secondary | ICD-10-CM | POA: Diagnosis not present

## 2020-06-11 DIAGNOSIS — U071 COVID-19: Secondary | ICD-10-CM | POA: Diagnosis not present

## 2020-06-11 DIAGNOSIS — J45909 Unspecified asthma, uncomplicated: Secondary | ICD-10-CM | POA: Diagnosis not present

## 2020-06-11 DIAGNOSIS — Z87891 Personal history of nicotine dependence: Secondary | ICD-10-CM | POA: Insufficient documentation

## 2020-06-11 DIAGNOSIS — R059 Cough, unspecified: Secondary | ICD-10-CM | POA: Diagnosis present

## 2020-06-11 DIAGNOSIS — R000 Tachycardia, unspecified: Secondary | ICD-10-CM | POA: Insufficient documentation

## 2020-06-11 LAB — COMPREHENSIVE METABOLIC PANEL
ALT: 26 U/L (ref 0–44)
AST: 42 U/L — ABNORMAL HIGH (ref 15–41)
Albumin: 3.3 g/dL — ABNORMAL LOW (ref 3.5–5.0)
Alkaline Phosphatase: 53 U/L (ref 38–126)
Anion gap: 15 (ref 5–15)
BUN: 20 mg/dL (ref 6–20)
CO2: 19 mmol/L — ABNORMAL LOW (ref 22–32)
Calcium: 8.3 mg/dL — ABNORMAL LOW (ref 8.9–10.3)
Chloride: 103 mmol/L (ref 98–111)
Creatinine, Ser: 0.8 mg/dL (ref 0.44–1.00)
GFR, Estimated: >60 mL/min (ref >60)
Glucose, Bld: 158 mg/dL — ABNORMAL HIGH (ref 70–99)
Potassium: 3.7 mmol/L (ref 3.5–5.1)
Sodium: 137 mmol/L (ref 135–145)
Total Bilirubin: 1.4 mg/dL — ABNORMAL HIGH (ref 0.3–1.2)
Total Protein: 7.8 g/dL (ref 6.5–8.1)

## 2020-06-11 LAB — CBC WITH DIFFERENTIAL/PLATELET
Abs Immature Granulocytes: 0.04 10*3/uL (ref 0.00–0.07)
Basophils Absolute: 0 10*3/uL (ref 0.0–0.1)
Basophils Relative: 0 %
Eosinophils Absolute: 0 10*3/uL (ref 0.0–0.5)
Eosinophils Relative: 0 %
HCT: 38.7 % (ref 36.0–46.0)
Hemoglobin: 13.3 g/dL (ref 12.0–15.0)
Immature Granulocytes: 1 %
Lymphocytes Relative: 11 %
Lymphs Abs: 0.7 10*3/uL (ref 0.7–4.0)
MCH: 28.9 pg (ref 26.0–34.0)
MCHC: 34.4 g/dL (ref 30.0–36.0)
MCV: 84.1 fL (ref 80.0–100.0)
Monocytes Absolute: 0.1 10*3/uL (ref 0.1–1.0)
Monocytes Relative: 2 %
Neutro Abs: 5.8 10*3/uL (ref 1.7–7.7)
Neutrophils Relative %: 86 %
Platelets: 266 10*3/uL (ref 150–400)
RBC: 4.6 MIL/uL (ref 3.87–5.11)
RDW: 12 % (ref 11.5–15.5)
WBC: 6.7 10*3/uL (ref 4.0–10.5)
nRBC: 0 % (ref 0.0–0.2)

## 2020-06-11 LAB — TROPONIN I (HIGH SENSITIVITY)
Troponin I (High Sensitivity): 5 ng/L (ref <18)
Troponin I (High Sensitivity): 6 ng/L (ref <18)

## 2020-06-11 LAB — BRAIN NATRIURETIC PEPTIDE: B Natriuretic Peptide: 19 pg/mL (ref 0.0–100.0)

## 2020-06-11 LAB — MAGNESIUM: Magnesium: 2.3 mg/dL (ref 1.7–2.4)

## 2020-06-11 LAB — FIBRIN DERIVATIVES D-DIMER (ARMC ONLY): Fibrin derivatives D-dimer (ARMC): >7500 ng/mL (FEU) — ABNORMAL HIGH (ref 0.00–499.00)

## 2020-06-11 LAB — PROCALCITONIN: Procalcitonin: <0.1 ng/mL

## 2020-06-11 MED ORDER — IOHEXOL 350 MG/ML SOLN
100.0000 mL | Freq: Once | INTRAVENOUS | Status: AC | PRN
  Administered 2020-06-11: 100 mL via INTRAVENOUS

## 2020-06-11 MED ORDER — LACTATED RINGERS IV BOLUS
1000.0000 mL | Freq: Once | INTRAVENOUS | Status: AC
  Administered 2020-06-11: 1000 mL via INTRAVENOUS

## 2020-06-11 NOTE — ED Notes (Signed)
Pt resting in room while watching tv with regular and unlabored breathing. Denies needs or concerns at this time. Awaiting imaging results.

## 2020-06-11 NOTE — ED Provider Notes (Signed)
[facility] Emergency Department Provider Note  ____________________________________________   First MD Initiated Contact with Patient 06/11/20 2003     (approximate)  I have reviewed the triage vital signs and the nursing notes.   HISTORY  Chief Complaint COVID +   HPI AMAIAH CRISTIANO is a 54 y.o. female with a past medical history of anemia, asthma, fibroids, and her palpitations who presents for assessment approximately 3 to 4 weeks of worsening shortness of breath associated with severe fatigue, poor p.o. intake and a positive Covid test 2 days ago.  Patient denies any other acute symptoms including headache, vision changes, cough, chest pain, dental pain, back pain, vomiting, diarrhea, dysuria, rash, extremity pain, other acute complaints.  Denies tobacco abuse, EtOH use, illicit drug use.  Denies taking daily medications.  States she has not received any Covid vaccines.  No other recent falls or injuries.         Past Medical History:  Diagnosis Date  . Anemia   . Asthma    no problems in 3 years at least  . Fibroid   . Fibroids   . Heart palpitations    occassional  . Numbness and tingling in right hand   . Obesity     Patient Active Problem List   Diagnosis Date Noted  . Post-operative state 05/16/2016  . Fibroids 01/13/2015    Past Surgical History:  Procedure Laterality Date  . HYSTERECTOMY ABDOMINAL WITH SALPINGECTOMY Bilateral 05/16/2016   Procedure: HYSTERECTOMY ABDOMINAL;  Surgeon: Emily Filbert, MD;  Location: [facility];  Service: Gynecology;  Laterality: Bilateral;  . SALPINGOOPHORECTOMY Bilateral 05/16/2016   Procedure: SALPINGO OOPHORECTOMY;  Surgeon: Emily Filbert, MD;  Location: [facility];  Service: Gynecology;  Laterality: Bilateral;    Prior to Admission medications   Medication Sig Start Date End Date Taking? Authorizing Provider  albuterol (PROVENTIL HFA;VENTOLIN HFA) 108 (90 Base) MCG/ACT inhaler Inhale 1-2 puffs into the  lungs every 6 (six) hours as needed for wheezing or shortness of breath.    [provider]  BIOTIN PO Take 1 tablet by mouth every morning.    [provider]  chlorpheniramine-HYDROcodone (TUSSIONEX PENNKINETIC ER) 10-8 MG/5ML SUER Take 5 mLs by mouth 2 (two) times daily. 10/26/18   Paulette Blanch, MD  ferrous sulfate 325 (65 FE) MG tablet TAKE 1 TABLET BY MOUTH TWICE A DAY 01/20/19   Dove, Myra C, MD  ferrous sulfate 325 (65 FE) MG tablet Take 1 tablet (325 mg total) by mouth 2 (two) times daily. 05/29/19   Emily Filbert, MD  ibuprofen (ADVIL,MOTRIN) 800 MG tablet Take 1 tablet (800 mg total) by mouth every 8 (eight) hours as needed for moderate pain. 10/26/18   Paulette Blanch, MD  methylPREDNISolone (MEDROL DOSEPAK) 4 MG TBPK tablet Take as directed 10/26/18   Paulette Blanch, MD  oxyCODONE-acetaminophen (PERCOCET/ROXICET) 5-325 MG tablet Take 1-2 tablets by mouth every 4 (four) hours as needed for severe pain (moderate to severe pain (when tolerating fluids)). Patient not taking: Reported on 06/30/2016 05/18/16   Emily Filbert, MD  vitamin E 1000 UNIT capsule Take 1,000 Units by mouth daily.    [provider]    Allergies Patient has no known allergies.  Family History  Problem Relation Age of Onset  . Hypertension Father   . Hypertension Mother   . Diabetes Mother   . Hypertension Sister     Social History Social History   Tobacco Use  .  Smoking status: Former Research scientist (life sciences)  . Smokeless tobacco: Never Used  Substance Use Topics  . Alcohol use: Yes    Comment: occasionally  . Drug use: Not Currently    Types: Marijuana    Comment: past use    Review of Systems  Review of Systems  Constitutional: Positive for chills and malaise/fatigue. Negative for fever.  HENT: Negative for hearing loss and sore throat.   Eyes: Negative for pain.  Respiratory: Positive for shortness of breath. Negative for cough and stridor.   Cardiovascular: Negative for chest pain.    Gastrointestinal: Negative for vomiting.  Genitourinary: Negative for dysuria.  Musculoskeletal: Positive for myalgias.  Skin: Negative for rash.  Neurological: Negative for seizures, loss of consciousness and headaches.  Psychiatric/Behavioral: Negative for suicidal ideas.  All other systems reviewed and are negative.     ____________________________________________   PHYSICAL EXAM:  VITAL SIGNS: ED Triage Vitals  Enc Vitals Group     BP 06/11/20 1920 124/70     Pulse Rate 06/11/20 1919 (!) 110     Resp 06/11/20 1919 20     Temp 06/11/20 1919 99.1 F (37.3 C)     Temp Source 06/11/20 1919 Oral     SpO2 06/11/20 1919 95 %     Weight 06/11/20 1919 180 lb (81.6 kg)     Height 06/11/20 1919 5\' 3"  (1.6 m)     Head Circumference --      Peak Flow --      Pain Score 06/11/20 1919 0     Pain Loc --      Pain Edu? --      Excl. in [place]? --    Vitals:   06/11/20 2230 06/11/20 2330  BP: 133/82 (!) 138/114  Pulse: 83 85  Resp: 19 18  Temp:    SpO2: 97% 95%   Physical Exam Vitals and nursing note reviewed.  Constitutional:      General: She is not in acute distress.    Appearance: She is well-developed.  HENT:     Head: Normocephalic and atraumatic.     Right Ear: External ear normal.     Left Ear: External ear normal.     Nose: Nose normal.     Mouth/Throat:     Mouth: Mucous membranes are dry.  Eyes:     Conjunctiva/sclera: Conjunctivae normal.  Cardiovascular:     Rate and Rhythm: Regular rhythm. Tachycardia present.     Heart sounds: No murmur heard.   Pulmonary:     Effort: Pulmonary effort is normal. No respiratory distress.     Breath sounds: Examination of the right-middle field reveals decreased breath sounds. Examination of the left-middle field reveals decreased breath sounds. Examination of the right-lower field reveals decreased breath sounds. Examination of the left-lower field reveals decreased breath sounds. Decreased breath sounds present.   Abdominal:     Palpations: Abdomen is soft.     Tenderness: There is no abdominal tenderness.  Musculoskeletal:     Cervical back: Neck supple.  Skin:    General: Skin is warm and dry.     Capillary Refill: Capillary refill takes 2 to 3 seconds.  Neurological:     Mental Status: She is alert and oriented to person, place, and time.  Psychiatric:        Mood and Affect: Mood normal.      ____________________________________________   LABS (all labs ordered are listed, but only abnormal results are displayed)  Labs Reviewed  COMPREHENSIVE  METABOLIC PANEL - Abnormal; Notable for the following components:      Result Value   CO2 19 (*)    Glucose, Bld 158 (*)    Calcium 8.3 (*)    Albumin 3.3 (*)    AST 42 (*)    Total Bilirubin 1.4 (*)    All other components within normal limits  FIBRIN DERIVATIVES D-DIMER ([facility] ONLY) - Abnormal; Notable for the following components:   Fibrin derivatives D-dimer ([facility]) >7,500.00 (*)    All other components within normal limits  RESPIRATORY PANEL BY RT PCR (FLU A&B, COVID)  CBC WITH DIFFERENTIAL/PLATELET  BRAIN NATRIURETIC PEPTIDE  MAGNESIUM  PROCALCITONIN  TROPONIN I (HIGH SENSITIVITY)  TROPONIN I (HIGH SENSITIVITY)   ____________________________________________  EKG  Sinus rhythm with a ventricular rate of 85, normal axis, unremarkable intervals, no evidence of acute ischemia or other significant underlying arrhythmia. ____________________________________________  RADIOLOGY  ED MD interpretation: Chest x-ray shows evidence of bilateral pneumonia without significant effusion, edema, widening this time, or pneumothorax.  Official radiology report(s): DG Chest 2 View  Result Date: 06/11/2020 CLINICAL DATA:  Struck shortness of breath, COVID-19 positive, hypoxic on room air EXAM: CHEST - 2 VIEW COMPARISON:  Radiograph 10/25/2018 FINDINGS: Heterogeneous airspace opacities are present bilaterally with a peripheral and mid to lower  lung predominance. No pneumothorax or visible effusion. The cardiomediastinal contours are unremarkable. No acute osseous or soft tissue abnormality. IMPRESSION: Bilateral heterogeneous airspace opacities with a peripheral and mid to lower lung predominance, consistent with multifocal pneumonia in the setting of COVID-19 positivity. Electronically Signed   By: Lovena Le M.D.   On: 06/11/2020 19:43   CT Angio Chest PE W and/or Wo Contrast  Result Date: 06/11/2020 CLINICAL DATA:  54 year old female with concern for pulmonary embolism. Positive COVID-19. EXAM: CT ANGIOGRAPHY CHEST WITH CONTRAST TECHNIQUE: Multidetector CT imaging of the chest was performed using the standard protocol during bolus administration of intravenous contrast. Multiplanar CT image reconstructions and MIPs were obtained to evaluate the vascular anatomy. CONTRAST:  152mL OMNIPAQUE IOHEXOL 350 MG/ML SOLN COMPARISON:  Chest radiograph dated 06/11/2020. FINDINGS: Evaluation of this exam is limited due to respiratory motion artifact. Cardiovascular: Top-normal cardiac size. No pericardial effusion. The thoracic aorta is unremarkable. No CT evidence of pulmonary embolism. Mediastinum/Nodes: No hilar adenopathy. Subcarinal lymph node measures 12 mm in short axis. The esophagus is grossly unremarkable. No mediastinal fluid collection. Lungs/Pleura: Bilateral patchy ground-glass densities with peripheral and subpleural distribution consistent with multifocal pneumonia and in keeping with COVID-19. Clinical correlation and follow-up to resolution recommended. There is no pleural effusion pneumothorax. The central airways are patent. Upper Abdomen: Partially visualized 1 cm right renal upper pole hypodense lesion, indeterminate. Musculoskeletal: No chest wall abnormality. No acute or significant osseous findings. Review of the MIP images confirms the above findings. IMPRESSION: 1. No CT evidence of pulmonary embolism. 2. Multifocal pneumonia in  keeping with COVID-19. Clinical correlation and follow-up to resolution recommended. Electronically Signed   By: Anner Crete M.D.   On: 06/11/2020 23:17    ____________________________________________   PROCEDURES  Procedure(s) performed (including Critical Care):  .1-3 Lead EKG Interpretation Performed by: Lucrezia Starch, MD Authorized by: Lucrezia Starch, MD     Interpretation: normal     ECG rate assessment: normal     Rhythm: sinus rhythm     Ectopy: none     Conduction: normal       ____________________________________________   INITIAL IMPRESSION / ASSESSMENT AND PLAN / ED COURSE  Patient presents with [place] to history exam for assessment of malaise poor p.o. intake and worsening shortness of breath over the last 3 to 4 weeks as well as a reported positive Covid test 2 days ago.  Patient is slightly tachycardic with a heart rate of 110 with otherwise stable vital signs on 2 L nasal cannula which was placed by EMS.  I did remove these on my assessment the patient was noted to maintain her SP2 saturations 94 to 95% on room air.  Exam as above remarkable for some mild tachycardia, dry mucous membranes, and bilaterally decreased breath sounds.  Given positive Covid test this is likely the etiology of patient's presenting symptoms.  However it is possible she also has a superimposed bacterial pneumonia.  Also assess for evidence of significant dehydration, metabolic derangements, anemia, ACS, arrhythmia and PE.   CTA chest obtained due to elevated D-dimer shows no evidence of PE but does show evidence of bilateral pneumonia with no evidence of volume overload.  Low suspicion for ACS given reassuring EKG and to nonelevated troponins.  No evidence of myocarditis.  CBC is unremarkable has no evidence of anemia or leukocytosis.  CMP shows a glucose of 158 and a bicarb of 19 with an anion gap of 15 and no other significant derangements.  This is most consistent with mild  dehydration.  BNP is double digits at 19 consistent with overall clinical picture of not being volume overloaded.  Magnesium is within normal notes.  Procalcitonin is less than 0.1.  Given absence of fever, cough, elevated white blood cell count, and undetectable procalcitonin low suspicion for acute bacterial infection at this time.  I did ambulate the patient on room air she was noted to have an SPO2 saturation of between 9496%.  In addition she did not become tachycardic or hypotensive or lightheaded.  Impression is COVID-19 pneumonia.  I did review patient's Covid test which showed on her phone which she obtained at [facility] 2 days ago.  Given stable vital signs with no evidence of hypoxic respiratory failure or other significant derangements I believe she is safe for discharge plan for outpatient PCP follow-up and Covid [facility] infusion.  I did message the [facility] center to have patient scheduled for an outpatient infusion.  ____________________________________________   FINAL CLINICAL IMPRESSION(S) / ED DIAGNOSES  Final diagnoses:  Pneumonia due to COVID-19 virus    Medications  lactated ringers bolus 1,000 mL (0 mLs Intravenous Stopped 06/11/20 2235)  iohexol (OMNIPAQUE) 350 MG/ML injection 100 mL (100 mLs Intravenous Contrast Given 06/11/20 2240)     ED Discharge Orders    None       Note:  This document was prepared using Dragon voice recognition software and may include unintentional dictation errors.   Lucrezia Starch, MD 06/12/20 Dyann Kief

## 2020-06-11 NOTE — ED Triage Notes (Signed)
Pt comes into the ED via POV from home c/o COVID+ since 2 days ago.  88-90% RA, increased SHOB with diminished LL lungs.  Received 2 duonebs, 125 solumedrol.  20g L AC.  Afebrile, CBG 144. 96% on 2L [place].

## 2020-06-11 NOTE — ED Triage Notes (Signed)
Pt c/o SHOB for "quite awhile", states tested +for COVID a few days ago.  Denies CP Pt speaking in complete sentences. NAD noted 95% on RA

## 2020-06-12 ENCOUNTER — Telehealth: Payer: Self-pay | Admitting: Physician Assistant

## 2020-06-12 NOTE — Telephone Encounter (Signed)
Called to discuss with patient about Covid symptoms and the use of bamlanivimab/etesevimab or casirivimab/imdevimab, a monoclonal antibody infusion for those with mild to moderate Covid symptoms and at a high risk of hospitalization.  Pt is qualified for this infusion at the [place] [facility] due to; Specific high risk criteria : BMI > 25 and Chronic Lung Disease   Message left to call back our hotline [phone]. Also sent mychart message. Based on ER notes, she may have had symptoms for 3-4 weeks. Will try to get a hold of her to clarify symptoms onset.   Angelena Form PA-C  [facility]

## 2020-06-12 NOTE — ED Notes (Signed)
DC reviewed by RN with pt and pt sister via phone. Pt sister expressed frustration for pt being discharged at this time. Pt and sister educated that the pt does not meet medical requirements for a hospital admission. Pt made aware to listen for a call from the infusion clinic tomorrow. Brother arriving to take pt home. AO x4. Talking in full sentences with regular and unlabored breathing.

## 2020-06-13 ENCOUNTER — Encounter: Payer: Self-pay | Admitting: Oncology

## 2020-06-13 ENCOUNTER — Telehealth (HOSPITAL_COMMUNITY): Payer: Self-pay | Admitting: Oncology

## 2020-06-13 NOTE — Telephone Encounter (Signed)
Re: [facility] Infusion  Called to Discuss with patient about Covid symptoms and the use of regeneron, a monoclonal antibody infusion for those with mild to moderate Covid symptoms and at a high risk of hospitalization.     Pt is qualified for this infusion at the [place] [facility] center due to co-morbid conditions and/or a member of an at-risk group.    Past Medical History:  Diagnosis Date  . Anemia   . Asthma    no problems in 3 years at least  . Fibroid   . Fibroids   . Heart palpitations    occassional  . Numbness and tingling in right hand   . Obesity     Specific risk condition-asthma   Does not qualify S/s started greater then 2 weeks ago.   Gave her the [facility] telephone #   Rulon Abide, AGNP-C [phone] [facility]

## 2020-06-17 ENCOUNTER — Encounter: Payer: Self-pay | Admitting: *Deleted

## 2021-06-03 ENCOUNTER — Ambulatory Visit: Payer: No Typology Code available for payment source | Admitting: Internal Medicine

## 2023-04-09 ENCOUNTER — Other Ambulatory Visit: Payer: Self-pay | Admitting: Family Medicine

## 2023-04-09 DIAGNOSIS — Z1231 Encounter for screening mammogram for malignant neoplasm of breast: Secondary | ICD-10-CM

## 2023-04-17 ENCOUNTER — Encounter: Payer: Self-pay | Admitting: Family Medicine

## 2023-04-18 ENCOUNTER — Other Ambulatory Visit: Payer: Self-pay | Admitting: Family Medicine

## 2023-04-18 DIAGNOSIS — K439 Ventral hernia without obstruction or gangrene: Secondary | ICD-10-CM

## 2023-05-10 ENCOUNTER — Ambulatory Visit
Admission: RE | Admit: 2023-05-10 | Discharge: 2023-05-10 | Disposition: A | Discharge status: Home / Self Care | Payer: Medicaid Other | Source: Ambulatory Visit | Attending: Family Medicine | Admitting: Family Medicine

## 2023-05-10 DIAGNOSIS — K439 Ventral hernia without obstruction or gangrene: Secondary | ICD-10-CM | POA: Diagnosis present

## 2023-05-11 ENCOUNTER — Ambulatory Visit
Admission: RE | Admit: 2023-05-11 | Discharge: 2023-05-11 | Disposition: A | Discharge status: Home / Self Care | Payer: Medicaid Other | Source: Ambulatory Visit | Attending: Family Medicine | Admitting: Family Medicine

## 2023-05-11 DIAGNOSIS — Z1231 Encounter for screening mammogram for malignant neoplasm of breast: Secondary | ICD-10-CM | POA: Diagnosis present

## 2023-09-12 ENCOUNTER — Other Ambulatory Visit: Payer: Self-pay | Admitting: Gastroenterology

## 2023-09-12 ENCOUNTER — Encounter: Payer: Self-pay | Admitting: Gastroenterology

## 2023-09-12 DIAGNOSIS — Z1211 Encounter for screening for malignant neoplasm of colon: Secondary | ICD-10-CM

## 2023-09-12 DIAGNOSIS — R935 Abnormal findings on diagnostic imaging of other abdominal regions, including retroperitoneum: Secondary | ICD-10-CM

## 2023-10-02 ENCOUNTER — Ambulatory Visit
Admission: RE | Admit: 2023-10-02 | Discharge: 2023-10-02 | Disposition: A | Discharge status: Home / Self Care | Payer: Medicaid Other | Source: Ambulatory Visit | Attending: Gastroenterology | Admitting: Gastroenterology

## 2023-10-02 DIAGNOSIS — R935 Abnormal findings on diagnostic imaging of other abdominal regions, including retroperitoneum: Secondary | ICD-10-CM

## 2023-10-02 DIAGNOSIS — Z1211 Encounter for screening for malignant neoplasm of colon: Secondary | ICD-10-CM

## 2023-10-02 MED ORDER — IOPAMIDOL (ISOVUE-370) INJECTION 76%
80.0000 mL | Freq: Once | INTRAVENOUS | Status: AC | PRN
  Administered 2023-10-02: 80 mL via INTRAVENOUS

## 2023-10-19 ENCOUNTER — Ambulatory Visit (INDEPENDENT_AMBULATORY_CARE_PROVIDER_SITE_OTHER): Payer: Medicaid Other

## 2023-10-19 DIAGNOSIS — Z1211 Encounter for screening for malignant neoplasm of colon: Secondary | ICD-10-CM | POA: Diagnosis not present
